# Patient Record
Sex: Male | Born: 1980 | Race: White | Hispanic: No | Marital: Married | Smoking: Never smoker
Health system: Southern US, Community
[De-identification: ages and names within clinical notes are randomized; demographics above are authoritative.]

## PROBLEM LIST (undated history)

## (undated) DIAGNOSIS — T7840XA Allergy, unspecified, initial encounter: Secondary | ICD-10-CM

## (undated) DIAGNOSIS — N200 Calculus of kidney: Secondary | ICD-10-CM

## (undated) DIAGNOSIS — K760 Fatty (change of) liver, not elsewhere classified: Secondary | ICD-10-CM

## (undated) DIAGNOSIS — Z8719 Personal history of other diseases of the digestive system: Secondary | ICD-10-CM

## (undated) HISTORY — DX: Allergy, unspecified, initial encounter: T78.40XA

---

## 2018-04-10 DIAGNOSIS — Z809 Family history of malignant neoplasm, unspecified: Secondary | ICD-10-CM | POA: Insufficient documentation

## 2018-04-10 DIAGNOSIS — E66811 Obesity, class 1: Secondary | ICD-10-CM | POA: Insufficient documentation

## 2018-04-10 DIAGNOSIS — J302 Other seasonal allergic rhinitis: Secondary | ICD-10-CM | POA: Insufficient documentation

## 2019-10-06 DIAGNOSIS — K439 Ventral hernia without obstruction or gangrene: Secondary | ICD-10-CM | POA: Insufficient documentation

## 2019-11-05 DIAGNOSIS — K429 Umbilical hernia without obstruction or gangrene: Secondary | ICD-10-CM

## 2019-11-05 HISTORY — PX: HERNIA REPAIR: SHX51

## 2019-11-05 HISTORY — DX: Umbilical hernia without obstruction or gangrene: K42.9

## 2019-11-17 DIAGNOSIS — Z09 Encounter for follow-up examination after completed treatment for conditions other than malignant neoplasm: Secondary | ICD-10-CM | POA: Insufficient documentation

## 2020-03-22 DIAGNOSIS — E7800 Pure hypercholesterolemia, unspecified: Secondary | ICD-10-CM | POA: Insufficient documentation

## 2020-03-22 DIAGNOSIS — R7989 Other specified abnormal findings of blood chemistry: Secondary | ICD-10-CM | POA: Insufficient documentation

## 2020-05-23 DIAGNOSIS — S46219A Strain of muscle, fascia and tendon of other parts of biceps, unspecified arm, initial encounter: Secondary | ICD-10-CM | POA: Insufficient documentation

## 2020-05-23 DIAGNOSIS — M25522 Pain in left elbow: Secondary | ICD-10-CM | POA: Insufficient documentation

## 2020-05-30 NOTE — Progress Notes (Signed)
[facility][id number] Mario Cooper, [place] - [address] AT [facility] [address] [place] [address] Phone: [phone] Fax: [phone]      Your procedure is scheduled on Thursday July 1  Report to [facility] Main Entrance "A" at 0530 A.M., and check in at the Admitting office.  Call this number if you have problems the morning of surgery:  [phone]  Call [phone] if you have any questions prior to your surgery date Monday-Friday 8am-4pm    Remember:  Do not eat  after midnight the night before your surgery  You may drink clear liquids until 0430 am the morning of your surgery.   Clear liquids allowed are: Water, Non-Citrus Juices (without pulp), Carbonated Beverages, Clear Tea, Black Coffee Only, and Gatorade   Enhanced Recovery after Surgery for Orthopedics Enhanced Recovery after Surgery is a protocol used to improve the stress on your body and your recovery after surgery.  Patient Instructions  . The night before surgery:  o No food after midnight. ONLY clear liquids after midnight   . The day of surgery (if you have diabetes): o  o Drink ONE (1) Gatorade 2 (G2) by 0430 am the morning of surgery o This drink was given to you during your hospital  pre-op appointment visit.  Color of the Gatorade may vary. Red is not allowed. o Nothing else to drink after completing the  Gatorade 2 (G2).         If you have questions, please contact your surgeon's office.     Take these medicines the morning of surgery with A SIP OF WATER  loratadine (CLARITIN)  As of today, STOP taking any Aspirin (unless otherwise instructed by your surgeon) and Aspirin containing products, Aleve, Naproxen, Ibuprofen, Motrin, Advil, Goody's, BC's, all herbal medications, fish oil, and all vitamins.                      Do not wear jewelry            Do not wear lotions, powders, colognes, or deodorant.             Men may shave face  and neck.            Do not bring valuables to the hospital.            [facility] is not responsible for any belongings or valuables.  Do NOT Smoke (Tobacco/Vapping) or drink Alcohol 24 hours prior to your procedure If you use a CPAP at night, you may bring all equipment for your overnight stay.   Contacts, glasses, dentures or bridgework may not be worn into surgery.      For patients admitted to the hospital, discharge time will be determined by your treatment team.   Patients discharged the day of surgery will not be allowed to drive home, and someone needs to stay with them for 24 hours.    Special instructions:   [place]- Preparing For Surgery  Before surgery, you can play an important role. Because skin is not sterile, your skin needs to be as free of germs as possible. You can reduce the number of germs on your skin by washing with CHG (chlorahexidine gluconate) Soap before surgery.  CHG is an antiseptic cleaner which kills germs and bonds with the skin to continue killing germs even after washing.    Oral Hygiene is also important to reduce your  risk of infection.  Remember - BRUSH YOUR TEETH THE MORNING OF SURGERY WITH YOUR REGULAR TOOTHPASTE  Please do not use if you have an allergy to CHG or antibacterial soaps. If your skin becomes reddened/irritated stop using the CHG.  Do not shave (including legs and underarms) for at least 48 hours prior to first CHG shower. It is OK to shave your face.  Please follow these instructions carefully.   1. Shower the NIGHT BEFORE SURGERY and the MORNING OF SURGERY with CHG Soap.   2. If you chose to wash your hair, wash your hair first as usual with your normal shampoo.  3. After you shampoo, rinse your hair and body thoroughly to remove the shampoo.  4. Use CHG as you would any other liquid soap. You can apply CHG directly to the skin and wash gently with a scrungie or a clean washcloth.   5. Apply the CHG Soap to your body ONLY  FROM THE NECK DOWN.  Do not use on open wounds or open sores. Avoid contact with your eyes, ears, mouth and genitals (private parts). Wash Face and genitals (private parts)  with your normal soap.   6. Wash thoroughly, paying special attention to the area where your surgery will be performed.  7. Thoroughly rinse your body with warm water from the neck down.  8. DO NOT shower/wash with your normal soap after using and rinsing off the CHG Soap.  9. Pat yourself dry with a CLEAN TOWEL.  10. Wear CLEAN PAJAMAS to bed the night before surgery, wear comfortable clothes the morning of surgery  11. Place CLEAN SHEETS on your bed the night of your first shower and DO NOT SLEEP WITH PETS.   Day of Surgery:   Do not apply any deodorants/lotions.  Please wear clean clothes to the hospital/surgery center.   Remember to brush your teeth WITH YOUR REGULAR TOOTHPASTE.   Please read over the following fact sheets that you were given.

## 2020-05-31 ENCOUNTER — Other Ambulatory Visit: Payer: Self-pay

## 2020-05-31 ENCOUNTER — Encounter (HOSPITAL_COMMUNITY): Payer: Self-pay

## 2020-05-31 ENCOUNTER — Other Ambulatory Visit (HOSPITAL_COMMUNITY)
Admission: RE | Admit: 2020-05-31 | Discharge: 2020-05-31 | Disposition: A | Discharge status: Home / Self Care | Payer: BC Managed Care – PPO | Source: Ambulatory Visit | Attending: Orthopaedic Surgery | Admitting: Orthopaedic Surgery

## 2020-05-31 ENCOUNTER — Encounter (HOSPITAL_COMMUNITY)
Admission: RE | Admit: 2020-05-31 | Discharge: 2020-05-31 | Disposition: A | Discharge status: Home / Self Care | Payer: No Typology Code available for payment source | Source: Ambulatory Visit | Attending: Orthopaedic Surgery | Admitting: Orthopaedic Surgery

## 2020-05-31 DIAGNOSIS — Z20822 Contact with and (suspected) exposure to covid-19: Secondary | ICD-10-CM | POA: Insufficient documentation

## 2020-05-31 DIAGNOSIS — Z01812 Encounter for preprocedural laboratory examination: Secondary | ICD-10-CM | POA: Diagnosis not present

## 2020-05-31 HISTORY — DX: Fatty (change of) liver, not elsewhere classified: K76.0

## 2020-05-31 HISTORY — DX: Personal history of other diseases of the digestive system: Z87.19

## 2020-05-31 LAB — CBC
HCT: 44.6 % (ref 39.0–52.0)
Hemoglobin: 15.5 g/dL (ref 13.0–17.0)
MCH: 31.7 pg (ref 26.0–34.0)
MCHC: 34.8 g/dL (ref 30.0–36.0)
MCV: 91.2 fL (ref 80.0–100.0)
Platelets: 255 10*3/uL (ref 150–400)
RBC: 4.89 MIL/uL (ref 4.22–5.81)
RDW: 11.3 % — ABNORMAL LOW (ref 11.5–15.5)
WBC: 5.5 10*3/uL (ref 4.0–10.5)
nRBC: 0 % (ref 0.0–0.2)

## 2020-05-31 LAB — COMPREHENSIVE METABOLIC PANEL
ALT: 100 U/L — ABNORMAL HIGH (ref 0–44)
AST: 46 U/L — ABNORMAL HIGH (ref 15–41)
Albumin: 4.3 g/dL (ref 3.5–5.0)
Alkaline Phosphatase: 57 U/L (ref 38–126)
Anion gap: 5 (ref 5–15)
BUN: 11 mg/dL (ref 6–20)
CO2: 26 mmol/L (ref 22–32)
Calcium: 9.3 mg/dL (ref 8.9–10.3)
Chloride: 107 mmol/L (ref 98–111)
Creatinine, Ser: 0.75 mg/dL (ref 0.61–1.24)
GFR calc Af Amer: >60 mL/min (ref >60)
GFR calc non Af Amer: >60 mL/min (ref >60)
Glucose, Bld: 96 mg/dL (ref 70–99)
Potassium: 4.1 mmol/L (ref 3.5–5.1)
Sodium: 138 mmol/L (ref 135–145)
Total Bilirubin: 0.9 mg/dL (ref 0.3–1.2)
Total Protein: 7.2 g/dL (ref 6.5–8.1)

## 2020-05-31 LAB — SURGICAL PCR SCREEN
MRSA, PCR: NEGATIVE
Staphylococcus aureus: NEGATIVE

## 2020-05-31 LAB — SARS CORONAVIRUS 2 (TAT 6-24 HRS): SARS Coronavirus 2: NEGATIVE

## 2020-05-31 MED ORDER — VANCOMYCIN HCL 1500 MG/300ML IV SOLN
1500.0000 mg | INTRAVENOUS | Status: AC
  Administered 2020-06-01: 1500 mg via INTRAVENOUS
  Filled 2020-05-31 (×2): qty 300

## 2020-05-31 MED ORDER — TRANEXAMIC ACID 1000 MG/10ML IV SOLN
2000.0000 mg | INTRAVENOUS | Status: DC
  Filled 2020-05-31: qty 20

## 2020-05-31 NOTE — Anesthesia Preprocedure Evaluation (Addendum)
Anesthesia Evaluation  Patient identified by MRN, date of birth, ID band Patient awake    Reviewed: Allergy & Precautions, H&P , NPO status , Patient's Chart, lab work & pertinent test results  Airway Mallampati: II  TM Distance: >3 FB Neck ROM: Full    Dental no notable dental hx. (+) Teeth Intact, Dental Advisory Given   Pulmonary neg pulmonary ROS,    Pulmonary exam normal breath sounds clear to auscultation       Cardiovascular Exercise Tolerance: Good negative cardio ROS   Rhythm:Regular Rate:Normal     Neuro/Psych negative neurological ROS  negative psych ROS   GI/Hepatic negative GI ROS, Neg liver ROS,   Endo/Other  negative endocrine ROS  Renal/GU negative Renal ROS  negative genitourinary   Musculoskeletal   Abdominal   Peds  Hematology negative hematology ROS (+)   Anesthesia Other Findings   Reproductive/Obstetrics negative OB ROS                            Anesthesia Physical Anesthesia Plan  ASA: II  Anesthesia Plan: MAC and Regional   Post-op Pain Management:    Induction: Intravenous  PONV Risk Score and Plan: 1 and Propofol infusion, Midazolam, Dexamethasone and Ondansetron  Airway Management Planned: Simple Face Mask  Additional Equipment:   Intra-op Plan:   Post-operative Plan:   Informed Consent: I have reviewed the patients History and Physical, chart, labs and discussed the procedure including the risks, benefits and alternatives for the proposed anesthesia with the patient or authorized representative who has indicated his/her understanding and acceptance.       Plan Discussed with: CRNA and Surgeon  Anesthesia Plan Comments: (Hx of fatty liver followed by PCP with chronic mild elevation of transaminases. Previous hepatitis panel negative in 2015. Last labs by PCP 03/22/20 show AST 45 and ALT 117. Preop labs show AST 46 and ALT 100. Remainder of  labs unremarkable.)      Anesthesia Quick Evaluation

## 2020-05-31 NOTE — Progress Notes (Signed)
PCP - [facility] Cardiologist - denies  PPM/ICD - denies  Chest x-ray - N/A EKG - N/A Stress Test - denies ECHO - denies Cardiac Cath - denies  Sleep Study - denies CPAP - N/A  DM: denies  Blood Thinner Instructions: N/A Aspirin Instructions: N/A  ERAS Protcol - Yes PRE-SURGERY Ensure or G2- G2 given per order  COVID TEST- Scheduled for today 05/31/2020 after PAT appointment. Patient verbalized understanding of self-quarantine instructions, appointment time and place.  Anesthesia review: YES, records requested Per patient was told had a fatty liver, liver enzymes were elevated  Patient denies shortness of breath, fever, cough and chest pain at PAT appointment  All instructions explained to the patient, with a verbal understanding of the material. Patient agrees to go over the instructions while at home for a better understanding. Patient also instructed to self quarantine after being tested for COVID-19. The opportunity to ask questions was provided.

## 2020-06-01 ENCOUNTER — Ambulatory Visit (HOSPITAL_COMMUNITY): Payer: No Typology Code available for payment source | Admitting: Anesthesiology

## 2020-06-01 ENCOUNTER — Encounter (HOSPITAL_COMMUNITY): Payer: Self-pay | Admitting: Orthopaedic Surgery

## 2020-06-01 ENCOUNTER — Ambulatory Visit (HOSPITAL_COMMUNITY): Payer: No Typology Code available for payment source | Admitting: Physician Assistant

## 2020-06-01 ENCOUNTER — Ambulatory Visit (HOSPITAL_COMMUNITY)
Admission: RE | Admit: 2020-06-01 | Discharge: 2020-06-01 | Disposition: A | Discharge status: Home / Self Care | Payer: No Typology Code available for payment source | Attending: Orthopaedic Surgery | Admitting: Orthopaedic Surgery

## 2020-06-01 ENCOUNTER — Encounter (HOSPITAL_COMMUNITY)
Admission: RE | Disposition: A | Discharge status: Home / Self Care | Payer: Self-pay | Source: Home / Self Care | Attending: Orthopaedic Surgery

## 2020-06-01 ENCOUNTER — Ambulatory Visit (HOSPITAL_COMMUNITY): Payer: No Typology Code available for payment source

## 2020-06-01 DIAGNOSIS — Z880 Allergy status to penicillin: Secondary | ICD-10-CM | POA: Insufficient documentation

## 2020-06-01 DIAGNOSIS — S46212A Strain of muscle, fascia and tendon of other parts of biceps, left arm, initial encounter: Secondary | ICD-10-CM | POA: Diagnosis not present

## 2020-06-01 DIAGNOSIS — Y990 Civilian activity done for income or pay: Secondary | ICD-10-CM | POA: Insufficient documentation

## 2020-06-01 DIAGNOSIS — Z79899 Other long term (current) drug therapy: Secondary | ICD-10-CM | POA: Diagnosis not present

## 2020-06-01 DIAGNOSIS — X509XXA Other and unspecified overexertion or strenuous movements or postures, initial encounter: Secondary | ICD-10-CM | POA: Insufficient documentation

## 2020-06-01 HISTORY — PX: DISTAL BICEPS TENDON REPAIR: SHX1461

## 2020-06-01 SURGERY — REPAIR, TENDON, BICEPS, DISTAL
Anesthesia: Monitor Anesthesia Care | Site: Arm Upper | Laterality: Left

## 2020-06-01 MED ORDER — FENTANYL CITRATE (PF) 250 MCG/5ML IJ SOLN
INTRAMUSCULAR | Status: AC
  Filled 2020-06-01: qty 5

## 2020-06-01 MED ORDER — PROPOFOL 500 MG/50ML IV EMUL
INTRAVENOUS | Status: DC | PRN
  Administered 2020-06-01: 50 ug/kg/min via INTRAVENOUS

## 2020-06-01 MED ORDER — BUPIVACAINE HCL (PF) 0.25 % IJ SOLN
INTRAMUSCULAR | Status: AC
  Filled 2020-06-01: qty 30

## 2020-06-01 MED ORDER — CHLORHEXIDINE GLUCONATE 4 % EX LIQD: 60.0000 mL | Freq: Once | CUTANEOUS | Status: DC

## 2020-06-01 MED ORDER — MIDAZOLAM HCL 2 MG/2ML IJ SOLN
INTRAMUSCULAR | Status: DC | PRN
  Administered 2020-06-01: 2 mg via INTRAVENOUS

## 2020-06-01 MED ORDER — LACTATED RINGERS IV SOLN: INTRAVENOUS | Status: DC

## 2020-06-01 MED ORDER — TRANEXAMIC ACID 1000 MG/10ML IV SOLN
INTRAVENOUS | Status: DC | PRN
  Administered 2020-06-01: 2000 mg via TOPICAL

## 2020-06-01 MED ORDER — BUPIVACAINE-EPINEPHRINE (PF) 0.5% -1:200000 IJ SOLN
INTRAMUSCULAR | Status: DC | PRN
  Administered 2020-06-01: 30 mL via PERINEURAL

## 2020-06-01 MED ORDER — PROPOFOL 10 MG/ML IV BOLUS
INTRAVENOUS | Status: AC
  Filled 2020-06-01: qty 20

## 2020-06-01 MED ORDER — CHLORHEXIDINE GLUCONATE 0.12 % MT SOLN
OROMUCOSAL | Status: AC
  Administered 2020-06-01: 15 mL via OROMUCOSAL
  Filled 2020-06-01: qty 15

## 2020-06-01 MED ORDER — PROPOFOL 1000 MG/100ML IV EMUL
INTRAVENOUS | Status: AC
  Filled 2020-06-01: qty 100

## 2020-06-01 MED ORDER — ONDANSETRON HCL 4 MG/2ML IJ SOLN
INTRAMUSCULAR | Status: AC
  Filled 2020-06-01: qty 2

## 2020-06-01 MED ORDER — ACETAMINOPHEN 500 MG PO TABS
1000.0000 mg | ORAL_TABLET | Freq: Once | ORAL | Status: AC
  Administered 2020-06-01: 1000 mg via ORAL
  Filled 2020-06-01: qty 2

## 2020-06-01 MED ORDER — ORAL CARE MOUTH RINSE: 15.0000 mL | Freq: Once | OROMUCOSAL | Status: AC

## 2020-06-01 MED ORDER — HYDROCODONE-ACETAMINOPHEN 5-325 MG PO TABS: 1.0000 | ORAL_TABLET | Freq: Four times a day (QID) | ORAL | 0 refills | Status: DC | PRN

## 2020-06-01 MED ORDER — CHLORHEXIDINE GLUCONATE 0.12 % MT SOLN: 15.0000 mL | Freq: Once | OROMUCOSAL | Status: AC

## 2020-06-01 MED ORDER — POVIDONE-IODINE 10 % EX SWAB
2.0000 "application " | Freq: Once | CUTANEOUS | Status: AC
  Administered 2020-06-01: 2 via TOPICAL

## 2020-06-01 MED ORDER — VANCOMYCIN HCL IN DEXTROSE 1-5 GM/200ML-% IV SOLN
INTRAVENOUS | Status: AC
  Filled 2020-06-01: qty 200

## 2020-06-01 MED ORDER — FENTANYL CITRATE (PF) 250 MCG/5ML IJ SOLN
INTRAMUSCULAR | Status: DC | PRN
  Administered 2020-06-01: 50 ug via INTRAVENOUS

## 2020-06-01 MED ORDER — HYDROMORPHONE HCL 1 MG/ML IJ SOLN: 0.2500 mg | INTRAMUSCULAR | Status: DC | PRN

## 2020-06-01 MED ORDER — CELECOXIB 200 MG PO CAPS: 200.0000 mg | ORAL_CAPSULE | Freq: Once | ORAL | Status: AC

## 2020-06-01 MED ORDER — CELECOXIB 200 MG PO CAPS
ORAL_CAPSULE | ORAL | Status: AC
  Administered 2020-06-01: 200 mg via ORAL
  Filled 2020-06-01: qty 1

## 2020-06-01 MED ORDER — LACTATED RINGERS IV SOLN: INTRAVENOUS | Status: DC | PRN

## 2020-06-01 MED ORDER — LIDOCAINE 2% (20 MG/ML) 5 ML SYRINGE
INTRAMUSCULAR | Status: AC
  Filled 2020-06-01: qty 5

## 2020-06-01 MED ORDER — DEXAMETHASONE SODIUM PHOSPHATE 10 MG/ML IJ SOLN
INTRAMUSCULAR | Status: AC
  Filled 2020-06-01: qty 1

## 2020-06-01 MED ORDER — CLONIDINE HCL (ANALGESIA) 100 MCG/ML EP SOLN
EPIDURAL | Status: DC | PRN
  Administered 2020-06-01: 50 ug

## 2020-06-01 MED ORDER — LIDOCAINE 2% (20 MG/ML) 5 ML SYRINGE
INTRAMUSCULAR | Status: DC | PRN
  Administered 2020-06-01: 40 mg via INTRAVENOUS

## 2020-06-01 MED ORDER — MIDAZOLAM HCL 2 MG/2ML IJ SOLN
INTRAMUSCULAR | Status: AC
  Filled 2020-06-01: qty 2

## 2020-06-01 MED ORDER — ONDANSETRON HCL 4 MG/2ML IJ SOLN
INTRAMUSCULAR | Status: DC | PRN
  Administered 2020-06-01: 4 mg via INTRAVENOUS

## 2020-06-01 MED ORDER — DEXAMETHASONE SODIUM PHOSPHATE 10 MG/ML IJ SOLN
INTRAMUSCULAR | Status: DC | PRN
Start: 2020-06-01 — End: 2020-06-01
  Administered 2020-06-01: 4 mg via INTRAVENOUS

## 2020-06-01 SURGICAL SUPPLY — 62 items
BLADE CLIPPER SURG (BLADE) IMPLANT
BNDG ELASTIC 3X5.8 VLCR STR LF (GAUZE/BANDAGES/DRESSINGS) ×3 IMPLANT
BNDG ELASTIC 4X5.8 VLCR STR LF (GAUZE/BANDAGES/DRESSINGS) ×3 IMPLANT
BNDG ESMARK 4X9 LF (GAUZE/BANDAGES/DRESSINGS) ×3 IMPLANT
BNDG GAUZE ELAST 4 BULKY (GAUZE/BANDAGES/DRESSINGS) ×3 IMPLANT
BUTTON DISTAL BICEPS (Orthopedic Implant) ×3 IMPLANT
CHLORAPREP W/TINT 26 (MISCELLANEOUS) ×3 IMPLANT
CLOSURE STERI-STRIP 1/2X4 (GAUZE/BANDAGES/DRESSINGS) ×1
CLOSURE WOUND 1/2 X4 (GAUZE/BANDAGES/DRESSINGS) ×2
CLSR STERI-STRIP ANTIMIC 1/2X4 (GAUZE/BANDAGES/DRESSINGS) ×2 IMPLANT
CORD BIPOLAR FORCEPS 12FT (ELECTRODE) ×3 IMPLANT
COVER SURGICAL LIGHT HANDLE (MISCELLANEOUS) ×3 IMPLANT
COVER WAND RF STERILE (DRAPES) IMPLANT
CUFF TOURN SGL QUICK 18X4 (TOURNIQUET CUFF) ×3 IMPLANT
CUFF TOURN SGL QUICK 24 (TOURNIQUET CUFF) ×2
CUFF TRNQT CYL 24X4X16.5-23 (TOURNIQUET CUFF) ×1 IMPLANT
DECANTER SPIKE VIAL GLASS SM (MISCELLANEOUS) ×3 IMPLANT
DRAPE INCISE IOBAN 66X45 STRL (DRAPES) ×3 IMPLANT
DRAPE OEC MINIVIEW 54X84 (DRAPES) ×3 IMPLANT
DRAPE SURG 17X23 STRL (DRAPES) ×3 IMPLANT
DRAPE U-SHAPE 47X51 STRL (DRAPES) ×3 IMPLANT
DRSG PAD ABDOMINAL 8X10 ST (GAUZE/BANDAGES/DRESSINGS) ×3 IMPLANT
ELECT REM PT RETURN 9FT ADLT (ELECTROSURGICAL) ×3
ELECTRODE REM PT RTRN 9FT ADLT (ELECTROSURGICAL) ×1 IMPLANT
GAUZE SPONGE 4X4 12PLY STRL (GAUZE/BANDAGES/DRESSINGS) ×3 IMPLANT
GAUZE XEROFORM 5X9 LF (GAUZE/BANDAGES/DRESSINGS) IMPLANT
GLOVE BIOGEL PI IND STRL 8 (GLOVE) ×1 IMPLANT
GLOVE BIOGEL PI INDICATOR 8 (GLOVE) ×2
GLOVE SURG SYN 7.5  E (GLOVE) ×2
GLOVE SURG SYN 7.5 E (GLOVE) ×1 IMPLANT
GOWN STRL REUS W/ TWL LRG LVL3 (GOWN DISPOSABLE) ×2 IMPLANT
GOWN STRL REUS W/TWL LRG LVL3 (GOWN DISPOSABLE) ×4
INSERTER BUTTON (SYSTAGENIX WOUND MANAGEMENT) ×3 IMPLANT
KIT BASIN OR (CUSTOM PROCEDURE TRAY) ×3 IMPLANT
KIT TURNOVER KIT B (KITS) ×3 IMPLANT
LOOP VESSEL MAXI BLUE (MISCELLANEOUS) IMPLANT
MANIFOLD NEPTUNE II (INSTRUMENTS) IMPLANT
NEEDLE 1/2 CIR CATGUT .05X1.09 (NEEDLE) ×3 IMPLANT
NEEDLE HYPO 25GX1X1/2 BEV (NEEDLE) IMPLANT
NS IRRIG 1000ML POUR BTL (IV SOLUTION) ×3 IMPLANT
PACK ORTHO EXTREMITY (CUSTOM PROCEDURE TRAY) ×3 IMPLANT
PAD ARMBOARD 7.5X6 YLW CONV (MISCELLANEOUS) ×6 IMPLANT
PAD CAST 4YDX4 CTTN HI CHSV (CAST SUPPLIES) ×1 IMPLANT
PADDING CAST COTTON 4X4 STRL (CAST SUPPLIES) ×2
PIN DRILL ACL TIGHTROPE 4MM (PIN) ×3 IMPLANT
SLING ARM IMMOBILIZER LRG (SOFTGOODS) ×3 IMPLANT
SPLINT FIBERGLASS 4X30 (CAST SUPPLIES) ×3 IMPLANT
SPONGE LAP 4X18 RFD (DISPOSABLE) ×3 IMPLANT
STRIP CLOSURE SKIN 1/2X4 (GAUZE/BANDAGES/DRESSINGS) ×4 IMPLANT
SUT MNCRL AB 4-0 PS2 18 (SUTURE) ×3 IMPLANT
SUT SUPRAMID 3-0 (SUTURE) IMPLANT
SUT VIC AB 2-0 CT1 27 (SUTURE) ×2
SUT VIC AB 2-0 CT1 TAPERPNT 27 (SUTURE) ×1 IMPLANT
SUT VIC AB 3-0 FS2 27 (SUTURE) ×3 IMPLANT
SYR CONTROL 10ML LL (SYRINGE) IMPLANT
TOWEL GREEN STERILE (TOWEL DISPOSABLE) ×3 IMPLANT
TOWEL GREEN STERILE FF (TOWEL DISPOSABLE) ×3 IMPLANT
TUBE CONNECTING 12'X1/4 (SUCTIONS) ×1
TUBE CONNECTING 12X1/4 (SUCTIONS) ×2 IMPLANT
UNDERPAD 30X36 HEAVY ABSORB (UNDERPADS AND DIAPERS) ×3 IMPLANT
WATER STERILE IRR 1000ML POUR (IV SOLUTION) ×3 IMPLANT
YANKAUER SUCT BULB TIP NO VENT (SUCTIONS) ×3 IMPLANT

## 2020-06-01 NOTE — Transfer of Care (Signed)
Immediate Anesthesia Transfer of Care Note  Patient: Mario Cooper  Procedure(s) Performed: DISTAL BICEPS TENDON REPAIR (Left Arm Upper)  Patient Location: PACU  Anesthesia Type:MAC  Level of Consciousness: awake and patient cooperative  Airway & Oxygen Therapy: Patient Spontanous Breathing  Post-op Assessment: Report given to RN and Post -op Vital signs reviewed and stable  Post vital signs: Reviewed and stable  Last Vitals:  Vitals Value Taken Time  BP 126/84 06/01/20 0900  Temp    Pulse 73 06/01/20 0901  Resp 15 06/01/20 0901  SpO2 98 % 06/01/20 0901  Vitals shown include unvalidated device data.  Last Pain: There were no vitals filed for this visit.       Complications: No complications documented.

## 2020-06-01 NOTE — Discharge Instructions (Signed)
Discharge Instructions  - Keep dressings in place. Do not remove them. - The dressings must stay dry - Take all medication as prescribed. Transition to over the counter pain medication as your pain improves - Keep the hand elevated over the next 48-72 hours to help with pain and swelling - Move all digits not restricted by the dressings regularly to prevent stiffness - Please call to schedule a follow up appointment with Dr. Kohana Amble and therapy at [phone] for 10-14 days following surgery - Your pain medication have been sent digitally to your pharmacy  

## 2020-06-01 NOTE — Anesthesia Procedure Notes (Signed)
Procedure Name: MAC Date/Time: 06/01/2020 7:30 AM Performed by: Michele Rockers, CRNA Pre-anesthesia Checklist: Patient identified, Emergency Drugs available, Suction available, Timeout performed and Patient being monitored Patient Re-evaluated:Patient Re-evaluated prior to induction Oxygen Delivery Method: Simple face mask

## 2020-06-01 NOTE — H&P (Signed)
ORTHOPAEDIC H&P  PCP:  Medicine, [facility]  Chief Complaint: Left distal biceps tendon tear  HPI: Mario Cooper is a 39 y.o. male who complains of left distal bicep tendon tear.  2 weeks ago he was at work when he was moving a heavy cart.  He felt a pop in his left antecubital fossa and had swelling and ecchymosis to this area.  He was seen in my clinic and had an exam concerning for retracted distal bicep tendon tear.  A subsequent MRI was obtained which showed full-thickness rupture of the distal bicep tendon with approximately 6 cm of retraction.  We had a long discussion regarding treatment options including both nonoperative and operative management.  Nonoperative management would risk continued arm pain, elbow flexion and supination weakness.  Alternatively we did discuss proceeding forward with surgical fixation of his left distal biceps tendon.  The risks of surgery were discussed with him extensively in clinic and after having this discussion he did agree and wish to proceed forward with fixation of his left distal bicep tendon.  Past Medical History:  Diagnosis Date  . Fatty liver   . History of hiatal hernia   . Umbilical hernia 11/05/2019   Past Surgical History:  Procedure Laterality Date  . HERNIA REPAIR  11/05/2019   umbilical and hiatal   Social History   Socioeconomic History  . Marital status: Married    Spouse name: Not on file  . Number of children: Not on file  . Years of education: Not on file  . Highest education level: Not on file  Occupational History  . Not on file  Tobacco Use  . Smoking status: Never Smoker  . Smokeless tobacco: Never Used  Vaping Use  . Vaping Use: Never used  Substance and Sexual Activity  . Alcohol use: Never  . Drug use: Never  . Sexual activity: Not on file  Other Topics Concern  . Not on file  Social History Narrative  . Not on file   Social Determinants of Health   Financial Resource Strain:   .  Difficulty of Paying Living Expenses:   Food Insecurity:   . Worried About Programme researcher, broadcasting/film/video in the Last Year:   . Barista in the Last Year:   Transportation Needs:   . Freight forwarder (Medical):   Marland Kitchen Lack of Transportation (Non-Medical):   Physical Activity:   . Days of Exercise per Week:   . Minutes of Exercise per Session:   Stress:   . Feeling of Stress :   Social Connections:   . Frequency of Communication with Friends and Family:   . Frequency of Social Gatherings with Friends and Family:   . Attends Religious Services:   . Active Member of Clubs or Organizations:   . Attends Banker Meetings:   Marland Kitchen Marital Status:    History reviewed. No pertinent family history. Allergies  Allergen Reactions  . Penicillins Other (See Comments)    Childhood reaction   Prior to Admission medications   Medication Sig Start Date End Date Taking? Authorizing Provider  loratadine (CLARITIN) 10 MG tablet Take 10 mg by mouth daily.    [provider]  Multiple Vitamin (MULTIVITAMIN WITH MINERALS) TABS tablet Take 1 tablet by mouth daily.    [provider]  Omega-3 Fatty Acids (FISH OIL) 500 MG CAPS Take 500 mg by mouth daily.    [provider]   No  results found.  Positive ROS: All other systems have been reviewed and were otherwise negative with the exception of those mentioned in the HPI and as above.  Physical Exam: Constitutional: Healthy-appearing and normal body habitus who is in NAD. Ambulation normal.  Psychiatric: Normal affect. Oriented x3  Cardiovascular: Left radial pulse 2+ and capillary refill test normal. Edema none.  Musculoskeletal: Left hand no atrophy. Strength Left APB 5/5 and 1st DI 5/5.  Neurologic: Normal sensation to the ulnar nerve distribution, radial nerve distribution, and median nerve distribution.   Hand: Examination of the left upper extremity shows a grossly normal appearing left hand. There is  visible ecchymosis in the antecubital fossa and medial elbow. There are no open wounds, skin tenting or signs of infection. Patient has tenderness palpation along the antecubital fossa. The biceps tendon is not palpable within the antecubital fossa. He has tenderness to palpation distally in the arm and he has a visible contour abnormality of the biceps muscle when compared to the contralateral side. He has full range of motion of the digits and wrist.  Assessment: Left distal biceps tendon tear  Plan: Plan to proceed forward with open repair of the left distal biceps tendon.  Risks and benefits of surgery were discussed with patient which include but are not limited to infection, bleeding, damage to surrounding structures including blood vessels and nerves, pain, stiffness, tendon retear and need for additional procedures.  Informed consent was obtained and the patient's left arm was marked.  Plan for discharge home postoperatively with follow-up with me in approximately 10 to 14 days to begin early range of motion exercises.    Ernest Mallick, MD [phone]   06/01/2020 7:00 AM

## 2020-06-01 NOTE — Anesthesia Procedure Notes (Signed)
Anesthesia Regional Block: Supraclavicular block   Pre-Anesthetic Checklist: ,, timeout performed, Correct Patient, Correct Site, Correct Laterality, Correct Procedure, Correct Position, site marked, Risks and benefits discussed, pre-op evaluation,  At surgeon's request and post-op pain management  Laterality: Left  Prep: Maximum Sterile Barrier Precautions used, chloraprep       Needles:  Injection technique: Single-shot  Needle Type: Echogenic Stimulator Needle     Needle Length: 9cm  Needle Gauge: 21     Additional Needles:   Procedures:,,,, ultrasound used (permanent image in chart),,,,  Narrative:  Start time: 06/01/2020 7:03 AM End time: 06/01/2020 7:13 AM Injection made incrementally with aspirations every 5 mL. Anesthesiologist: Gaynelle Adu, MD  Additional Notes: 2% Lidocaine skin wheel.

## 2020-06-01 NOTE — Op Note (Signed)
PREOPERATIVE DIAGNOSIS: Left distal bicep tendon rupture  POSTOPERATIVE DIAGNOSIS: Same  ATTENDING PHYSICIAN: Maudry Mayhew. Jeannie Fend, III, MD who was present and scrubbed for the entire case   ASSISTANT SURGEON: None.   ANESTHESIA: Regional with MAC  SURGICAL PROCEDURES: Left distal bicep tendon repair  SURGICAL INDICATIONS: Patient is a 39 year old male who at work approximately 2 weeks ago was moving a heavy cart.  He felt a pop along the left antecubital fossa.  He subsequently developed significant pain and swelling in his arm.  He was seen by me in clinic where he was found to have exam consistent with a left distal bicep tendon rupture.  Subsequent MRI was obtained which confirmed this diagnosis and showed approximately 6 cm of proximal retraction of the tendon stump.  We had a long discussion regarding treatment options and I did recommend proceeding forward with surgical fixation of his distal bicep tendon.  This would help to prevent continued left arm pain and weakness and allow him to return back to work full duty.  We discussed the risk and benefits of surgery in clinic and after having this discussion he did wish to proceed and presents today for that.  FINDING: There is complete rupture of the distal bicep tendon with retraction into the antecubital fossa.  Successful distal bicep tendon repair was achieved using Arthrex Endobutton kit.  DESCRIPTION OF PROCEDURE: The patient was identified in the preoperative holding area where the risk benefits and alternatives of the procedure were discussed with patient once again.  These risks include but are not limited to infection, bleeding, damage to surrounding structures including blood vessels and nerves, pain, stiffness, tendon retear and need for additional procedures.  Informed consent was obtained at that time the patient's left arm was marked with a surgical marking pen.  He then underwent a left upper extremity plexus block by anesthesia.   He was brought to the operative suite where timeout was performed identifying the correct patient operative site.  He was positioned supine on the operative table with his hand outstretched on a hand table.  Preoperative antibiotics were administered.  The patient was induced under MAC sedation.  The left upper extremity was then prepped and draped in usual sterile fashion.  A sterile tourniquet was then placed on the upper arm.  The limb was exsanguinated and the tourniquet was inflated.  A longitudinal incision was made in the proximal, radial forearm.  This was approximately 2 to 3 fingerbreadths distal to the elbow flexion crease.  Blunt dissection was carried down through the subcutaneous tissues.  Working proximally, hematoma was visualized and the distal tendon stump was easily found in the subcutaneous tissues within the antecubital fossa.  A clamp was placed on the tendon and which was then retracted into the wound.  Further deep dissection was then performed.  Care was taken to protect crossing venous structures as dissection was continued deep to the bicipital tuberosity.  Fluoroscopic images were obtained which confirmed appropriate positioning and location of the bicipital tuberosity as dissection was performed.  Once reaching the proximal radius, there was visualization of bare bone at the bicipital tuberosity.  There is also some few strands of residual biceps tendon still attached to the tuberosity.  This area was gently debrided using a Cobb elevator as well as a rondure.  The guidepin for the Arthrex Endobutton set was then placed in the bicipital tuberosity and advanced bicortically.  This was confirmed to be in appropriate position under fluoroscopic images.  Attention  was then turned back to the biceps tendon stump.  The tendon and was gently debrided using pickup and 15 blade which removed some degenerative, frayed tendon tissue as well as inflamed tenosynovium.  A fiber loop suture was  then passed in a whipstitch fashion utilizing a Keith needle.  The suture tails were passed deep to the crossing vascular structures and the Endobutton was secured on the tendon ends.  A 7.5 mm reamer was then advanced over top of the guidepin unit cortically.  The wound was copiously irrigated removing the bony fragments.  The Endobutton was then passed through this reamed hole and the initial hole from the guidepin and secured on the posterior cortex of the proximal radius.  The button was flipped and the tendon was reduced down into the reamed hole.  This had secure fit and fill the hole quite well.  Single-stranded the suture was then passed twice through the tendon and tied down over top securing the biceps in its repaired position.  The elbow was gently ranged through some range of motion and found to have stable fixation of the biceps tendon with the tension on the tendon following this.  The tendon was tied with the elbow in approximately 30 to 40 degrees of flexion.  AP and lateral fluoroscopic images were once again obtained which showed secure fixation of the button on the posterior cortex of the proximal radius.  At this point the wound was once again copiously irrigated with normal saline.  The tourniquet was released and topical TXA was placed into the wound.  Hemostasis was achieved.  The patient had return of brisk capillary refill to all of his digits.  The deep skin was then closed with interrupted 3-0 Vicryl sutures followed by running 4-0 Monocryl suture and application of Steri-Strips.  4 x 4's, Kerlix and a well-padded long-arm splint were then placed.  Patient was awoken from his sedation and taken the PACU in stable condition.  He tolerated the procedure well and there were no complications.  RADIOGRAPHIC INTERPRETATION: AP and lateral radiographs of the left elbow show Endobutton secured on the posterior aspect of the proximal radius.  No fractures or dislocations are noted.  ESTIMATED  BLOOD LOSS: 10 mL  TOURNIQUET TIME: 37 minutes  SPECIMENS: None  POSTOPERATIVE PLAN: The patient will be discharged home and seen back in the office in approximately 10-14 days for wound check and then be sent to a therapist for for early range of motion of the elbow.  We will place him into a hinged elbow brace allowing full flexion but stopping 40 degrees shy of full extension.  He can then progress 10 degrees of range of motion each week until he has full range of motion.  At 6 weeks he can continue range of motion exercises and begin some light resistance exercises.  He can then begin full strengthening and exercises at 3 months postoperatively.  IMPLANTS: Arthrex Endobutton

## 2020-06-01 NOTE — Anesthesia Postprocedure Evaluation (Signed)
Anesthesia Post Note  Patient: Mario Cooper  Procedure(s) Performed: DISTAL BICEPS TENDON REPAIR (Left Arm Upper)     Patient location during evaluation: PACU Anesthesia Type: Regional and MAC Level of consciousness: awake and alert Pain management: pain level controlled Vital Signs Assessment: post-procedure vital signs reviewed and stable Respiratory status: spontaneous breathing, nonlabored ventilation and respiratory function stable Cardiovascular status: stable and blood pressure returned to baseline Postop Assessment: no apparent nausea or vomiting Anesthetic complications: no   No complications documented.  Last Vitals:  Vitals:   06/01/20 0915 06/01/20 0930  BP: 118/78 130/88  Pulse: 77 71  Resp: 17 (!) 22  Temp:  36.6 C  SpO2: 99% 98%    Last Pain:  Vitals:   06/01/20 0930  PainSc: 0-No pain                 Keimani Laufer,W. EDMOND

## 2020-06-05 ENCOUNTER — Encounter (HOSPITAL_COMMUNITY): Payer: Self-pay | Admitting: Orthopaedic Surgery

## 2021-10-06 ENCOUNTER — Encounter (HOSPITAL_BASED_OUTPATIENT_CLINIC_OR_DEPARTMENT_OTHER): Payer: Self-pay | Admitting: Emergency Medicine

## 2021-10-06 ENCOUNTER — Other Ambulatory Visit: Payer: Self-pay

## 2021-10-06 DIAGNOSIS — N23 Unspecified renal colic: Secondary | ICD-10-CM | POA: Insufficient documentation

## 2021-10-06 DIAGNOSIS — R109 Unspecified abdominal pain: Secondary | ICD-10-CM | POA: Diagnosis not present

## 2021-10-06 DIAGNOSIS — N132 Hydronephrosis with renal and ureteral calculous obstruction: Secondary | ICD-10-CM | POA: Diagnosis not present

## 2021-10-06 NOTE — ED Triage Notes (Signed)
P arrives pov with c/o left flank pain radiating to LLQ and dysuria.

## 2021-10-07 ENCOUNTER — Emergency Department (HOSPITAL_BASED_OUTPATIENT_CLINIC_OR_DEPARTMENT_OTHER): Payer: BC Managed Care – PPO

## 2021-10-07 ENCOUNTER — Emergency Department (HOSPITAL_BASED_OUTPATIENT_CLINIC_OR_DEPARTMENT_OTHER)
Admission: EM | Admit: 2021-10-07 | Discharge: 2021-10-07 | Disposition: A | Discharge status: Home / Self Care | Payer: BC Managed Care – PPO | Attending: Emergency Medicine | Admitting: Emergency Medicine

## 2021-10-07 DIAGNOSIS — N132 Hydronephrosis with renal and ureteral calculous obstruction: Secondary | ICD-10-CM | POA: Diagnosis not present

## 2021-10-07 DIAGNOSIS — N23 Unspecified renal colic: Secondary | ICD-10-CM

## 2021-10-07 LAB — CBC WITH DIFFERENTIAL/PLATELET
Abs Immature Granulocytes: 0.04 10*3/uL (ref 0.00–0.07)
Basophils Absolute: 0 10*3/uL (ref 0.0–0.1)
Basophils Relative: 0 %
Eosinophils Absolute: 0 10*3/uL (ref 0.0–0.5)
Eosinophils Relative: 0 %
HCT: 41.3 % (ref 39.0–52.0)
Hemoglobin: 15.1 g/dL (ref 13.0–17.0)
Immature Granulocytes: 0 %
Lymphocytes Relative: 10 %
Lymphs Abs: 1.3 10*3/uL (ref 0.7–4.0)
MCH: 32.8 pg (ref 26.0–34.0)
MCHC: 36.6 g/dL — ABNORMAL HIGH (ref 30.0–36.0)
MCV: 89.6 fL (ref 80.0–100.0)
Monocytes Absolute: 1.2 10*3/uL — ABNORMAL HIGH (ref 0.1–1.0)
Monocytes Relative: 10 %
Neutro Abs: 9.6 10*3/uL — ABNORMAL HIGH (ref 1.7–7.7)
Neutrophils Relative %: 80 %
Platelets: 236 10*3/uL (ref 150–400)
RBC: 4.61 MIL/uL (ref 4.22–5.81)
RDW: 11.1 % — ABNORMAL LOW (ref 11.5–15.5)
WBC: 12.1 10*3/uL — ABNORMAL HIGH (ref 4.0–10.5)
nRBC: 0 % (ref 0.0–0.2)

## 2021-10-07 LAB — BASIC METABOLIC PANEL
Anion gap: 9 (ref 5–15)
BUN: 15 mg/dL (ref 6–20)
CO2: 23 mmol/L (ref 22–32)
Calcium: 8.4 mg/dL — ABNORMAL LOW (ref 8.9–10.3)
Chloride: 102 mmol/L (ref 98–111)
Creatinine, Ser: 0.79 mg/dL (ref 0.61–1.24)
GFR, Estimated: >60 mL/min (ref >60)
Glucose, Bld: 107 mg/dL — ABNORMAL HIGH (ref 70–99)
Potassium: 3.7 mmol/L (ref 3.5–5.1)
Sodium: 134 mmol/L — ABNORMAL LOW (ref 135–145)

## 2021-10-07 LAB — URINALYSIS, ROUTINE W REFLEX MICROSCOPIC
Bilirubin Urine: NEGATIVE
Glucose, UA: NEGATIVE mg/dL
Ketones, ur: NEGATIVE mg/dL
Leukocytes,Ua: NEGATIVE
Nitrite: NEGATIVE
Protein, ur: NEGATIVE mg/dL
Specific Gravity, Urine: 1.02 (ref 1.005–1.030)
pH: 7 (ref 5.0–8.0)

## 2021-10-07 LAB — URINALYSIS, MICROSCOPIC (REFLEX)

## 2021-10-07 MED ORDER — KETOROLAC TROMETHAMINE 15 MG/ML IJ SOLN
15.0000 mg | Freq: Once | INTRAMUSCULAR | Status: AC
  Administered 2021-10-07: 15 mg via INTRAVENOUS
  Filled 2021-10-07: qty 1

## 2021-10-07 MED ORDER — TAMSULOSIN HCL 0.4 MG PO CAPS
0.4000 mg | ORAL_CAPSULE | Freq: Every day | ORAL | 0 refills | Status: DC
Start: 2021-10-07 — End: 2023-04-25

## 2021-10-07 MED ORDER — ONDANSETRON HCL 4 MG/2ML IJ SOLN
4.0000 mg | Freq: Once | INTRAMUSCULAR | Status: AC
  Administered 2021-10-07: 4 mg via INTRAVENOUS
  Filled 2021-10-07: qty 2

## 2021-10-07 MED ORDER — SODIUM CHLORIDE 0.9 % IV BOLUS
1000.0000 mL | Freq: Once | INTRAVENOUS | Status: AC
  Administered 2021-10-07: 1000 mL via INTRAVENOUS

## 2021-10-07 MED ORDER — OXYCODONE-ACETAMINOPHEN 5-325 MG PO TABS: 1.0000 | ORAL_TABLET | Freq: Four times a day (QID) | ORAL | 0 refills | Status: DC | PRN

## 2021-10-07 MED ORDER — ONDANSETRON 4 MG PO TBDP: 4.0000 mg | ORAL_TABLET | Freq: Three times a day (TID) | ORAL | 0 refills | Status: DC | PRN

## 2021-10-07 NOTE — ED Notes (Signed)
ED MD requested we contact this client re: incidental findings on radiological studies and recommend he make an appointment with is primary MD . Attempted to contact client twice today, on both episodes no answer per phone call, no opportunity to leave a voice mail to client.

## 2021-10-07 NOTE — Discharge Instructions (Addendum)
You have a 4 mm stone in your left ureter.  Drink plenty of fluids.    Get rechecked if you have fevers, cannot urinate or have uncontrolled pain.

## 2021-10-07 NOTE — ED Provider Notes (Signed)
[facility] EMERGENCY DEPARTMENT Provider Note   CSN: [id number] Arrival date & time: 10/06/21  1833     History Chief Complaint  Patient presents with   Flank Pain    Mario Cooper is a 40 y.o. male.  The history is provided by the patient.  Flank Pain Mario Cooper is a 40 y.o. male who presents to the Emergency Department complaining of flank pain.  He presents to the ED for evaluation of left flank pain.  First episode was one week ago, second episode on Thursday with associated vomiting.  Today developed recurrent pain in the left flank, nonradiating.  Pain is dull/throbbing.  Vomiting today.  Has clear urine with small clots.  No fever.  Chills today.  Dysuria today.       Past Medical History:  Diagnosis Date   Fatty liver    History of hiatal hernia    Umbilical hernia 11/05/2019    There are no problems to display for this patient.   Past Surgical History:  Procedure Laterality Date   DISTAL BICEPS TENDON REPAIR Left 06/01/2020   Procedure: DISTAL BICEPS TENDON REPAIR;  Surgeon: Ernest Mallick, MD;  Location: [facility] OR;  Service: Orthopedics;  Laterality: Left;    HERNIA REPAIR  11/05/2019   umbilical and hiatal       History reviewed. No pertinent family history.  Social History   Tobacco Use   Smoking status: Never   Smokeless tobacco: Never  Vaping Use   Vaping Use: Never used  Substance Use Topics   Alcohol use: Never   Drug use: Never    Home Medications Prior to Admission medications   Medication Sig Start Date End Date Taking? Authorizing Provider  ondansetron (ZOFRAN ODT) 4 MG disintegrating tablet Take 1 tablet (4 mg total) by mouth every 8 (eight) hours as needed for nausea or vomiting. 10/07/21  Yes Tilden Fossa, MD  oxyCODONE-acetaminophen (PERCOCET/ROXICET) 5-325 MG tablet Take 1 tablet by mouth every 6 (six) hours as needed for severe pain. 10/07/21  Yes Tilden Fossa, MD  tamsulosin (FLOMAX) 0.4 MG CAPS capsule Take 1  capsule (0.4 mg total) by mouth daily. 10/07/21  Yes Tilden Fossa, MD  HYDROcodone-acetaminophen [facility]) 5-325 MG tablet Take 1-2 tablets by mouth every 6 (six) hours as needed for moderate pain. 06/01/20   Cain Saupe III, MD  loratadine (CLARITIN) 10 MG tablet Take 10 mg by mouth daily.    [provider]  Multiple Vitamin (MULTIVITAMIN WITH MINERALS) TABS tablet Take 1 tablet by mouth daily.    [provider]  Omega-3 Fatty Acids (FISH OIL) 500 MG CAPS Take 500 mg by mouth daily.    [provider]    Allergies    Penicillins  Review of Systems   Review of Systems  Genitourinary:  Positive for flank pain.  All other systems reviewed and are negative.  Physical Exam Updated Vital Signs BP 123/72 (BP Location: Left Arm)   Pulse 67   Temp 98.6 F (37 C) (Oral)   Resp 18   Ht 6' (1.829 m)   Wt 93 kg   SpO2 97%   BMI 27.80 kg/m   Physical Exam Vitals and nursing note reviewed.  Constitutional:      Appearance: He is well-developed.  HENT:     Head: Normocephalic and atraumatic.  Cardiovascular:     Rate and Rhythm: Normal rate and regular rhythm.  Pulmonary:     Effort: Pulmonary effort is normal.  No respiratory distress.  Abdominal:     Palpations: Abdomen is soft.     Tenderness: There is no abdominal tenderness. There is no guarding or rebound.     Comments: Mild left cva tenderness  Musculoskeletal:        General: No tenderness.  Skin:    General: Skin is warm and dry.  Neurological:     Mental Status: He is alert and oriented to person, place, and time.  Psychiatric:        Behavior: Behavior normal.    ED Results / Procedures / Treatments   Labs (all labs ordered are listed, but only abnormal results are displayed) Labs Reviewed  URINALYSIS, ROUTINE W REFLEX MICROSCOPIC - Abnormal; Notable for the following components:      Result Value   Hgb urine dipstick SMALL (*)    All other components within normal limits  BASIC  METABOLIC PANEL - Abnormal; Notable for the following components:   Sodium 134 (*)    Glucose, Bld 107 (*)    Calcium 8.4 (*)    All other components within normal limits  CBC WITH DIFFERENTIAL/PLATELET - Abnormal; Notable for the following components:   WBC 12.1 (*)    MCHC 36.6 (*)    RDW 11.1 (*)    Neutro Abs 9.6 (*)    Monocytes Absolute 1.2 (*)    All other components within normal limits  URINALYSIS, MICROSCOPIC (REFLEX) - Abnormal; Notable for the following components:   Bacteria, UA RARE (*)    All other components within normal limits    EKG None  Radiology CT Renal Stone Study  Result Date: 10/07/2021 CLINICAL DATA:  Flank pain.  Concern for kidney stone. EXAM: CT ABDOMEN AND PELVIS WITHOUT CONTRAST TECHNIQUE: Multidetector CT imaging of the abdomen and pelvis was performed following the standard protocol without IV contrast. COMPARISON:  None. FINDINGS: Evaluation of this exam is limited in the absence of intravenous contrast. Lower chest: The visualized lung bases are clear. Coronary vascular calcification noted, advanced for the patient's age. No intra-abdominal free air or free fluid. Hepatobiliary: No focal liver abnormality is seen. No gallstones, gallbladder wall thickening, or biliary dilatation. Pancreas: Unremarkable. No pancreatic ductal dilatation or surrounding inflammatory changes. Spleen: Normal in size without focal abnormality. Adrenals/Urinary Tract: The adrenal glands unremarkable. There is a 4 mm stone at the left ureterovesical junction with mild left hydronephrosis. The right kidney, right ureter, and urinary bladder appear unremarkable. Stomach/Bowel: There is no bowel obstruction or active inflammation. The appendix is normal. Vascular/Lymphatic: The abdominal aorta and IVC are unremarkable. No portal vein gas. There is no adenopathy. Reproductive: The prostate and seminal vesicles are grossly unremarkable. Other: None Musculoskeletal: No acute or significant  osseous findings. IMPRESSION: 1. A 4 mm left UVJ stone with mild left hydronephrosis. 2. Coronary vascular calcification, advanced for the patient's age. Electronically Signed   By: Elgie Collard M.D.   On: 10/07/2021 02:33    Procedures Procedures   Medications Ordered in ED Medications  sodium chloride 0.9 % bolus 1,000 mL (0 mLs Intravenous Stopped 10/07/21 0514)  ondansetron (ZOFRAN) injection 4 mg (4 mg Intravenous Given 10/07/21 0228)  ketorolac (TORADOL) 15 MG/ML injection 15 mg (15 mg Intravenous Given 10/07/21 0229)    ED Course  I have reviewed the triage vital signs and the nursing notes.  Pertinent labs & imaging results that were available during my care of the patient were reviewed by me and considered in my medical decision making (see  chart for details).    MDM Rules/Calculators/A&P                          patient here for evaluation of one week of intermittent flank pain. Patient in severe pain on initial assessment. He was treated with medications with resolution of his pain on reassessment. CT scan is positive for a 4 mm distal ureteral stone. UA not consistent with UTI. BMP with normal renal function. Discussed with patient home care for renal colic. Discussed PCP or urology follow-up as well as return precautions.  There was an incidental finding of advance atherosclerosis for age on CT scan - this was not communicate with the patient at his time of ED visit. Nursing staff to inform patient via telephone communication regarding this incidental finding and need for outpatient follow-up for risk factor modification.  Final Clinical Impression(s) / ED Diagnoses Final diagnoses:  Ureteral colic    Rx / DC Orders ED Discharge Orders          Ordered    tamsulosin (FLOMAX) 0.4 MG CAPS capsule  Daily        10/07/21 0423    ondansetron (ZOFRAN ODT) 4 MG disintegrating tablet  Every 8 hours PRN        10/07/21 0423    oxyCODONE-acetaminophen (PERCOCET/ROXICET)  5-325 MG tablet  Every 6 hours PRN        10/07/21 0426             Tilden Fossa, MD 10/07/21 [id number]

## 2021-11-15 DIAGNOSIS — J069 Acute upper respiratory infection, unspecified: Secondary | ICD-10-CM | POA: Diagnosis not present

## 2021-11-15 DIAGNOSIS — E669 Obesity, unspecified: Secondary | ICD-10-CM | POA: Diagnosis not present

## 2021-11-15 DIAGNOSIS — R0981 Nasal congestion: Secondary | ICD-10-CM | POA: Diagnosis not present

## 2021-11-15 DIAGNOSIS — R059 Cough, unspecified: Secondary | ICD-10-CM | POA: Diagnosis not present

## 2021-11-21 DIAGNOSIS — Z6830 Body mass index (BMI) 30.0-30.9, adult: Secondary | ICD-10-CM | POA: Diagnosis not present

## 2021-11-21 DIAGNOSIS — J208 Acute bronchitis due to other specified organisms: Secondary | ICD-10-CM | POA: Diagnosis not present

## 2021-11-21 DIAGNOSIS — B9689 Other specified bacterial agents as the cause of diseases classified elsewhere: Secondary | ICD-10-CM | POA: Diagnosis not present

## 2022-05-08 DIAGNOSIS — J0100 Acute maxillary sinusitis, unspecified: Secondary | ICD-10-CM | POA: Diagnosis not present

## 2022-05-08 DIAGNOSIS — Z6830 Body mass index (BMI) 30.0-30.9, adult: Secondary | ICD-10-CM | POA: Diagnosis not present

## 2022-12-04 IMAGING — CT CT RENAL STONE PROTOCOL
2 of 4 series · 16 of 46 positions shown, 18 images · non-contrast
Comparison: None.

CLINICAL DATA: Flank pain.  Concern for kidney stone.

EXAM:
CT ABDOMEN AND PELVIS WITHOUT CONTRAST
TECHNIQUE: Multidetector CT imaging of the abdomen and pelvis was performed
following the standard protocol without IV contrast.

[Series 2: axial st · axial · 0.96mm/px · z∈[+722,+1182]mm · 13 of 102 slices shown, 15 images]
[im 5/102  soft-tissue]
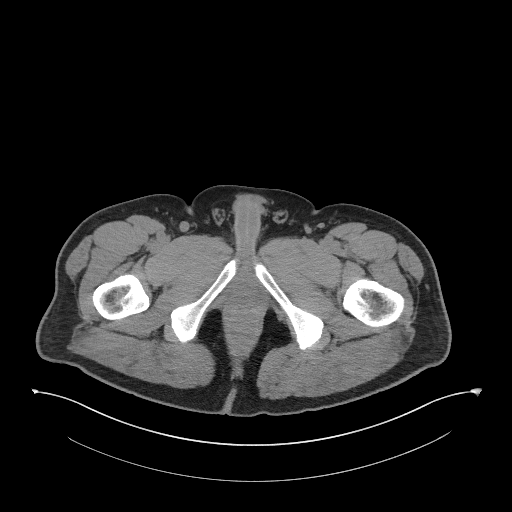
[im 5/102  bone]
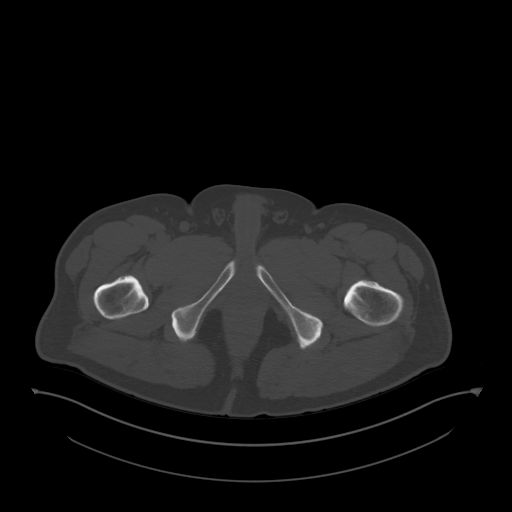
[im 13/102  soft-tissue]
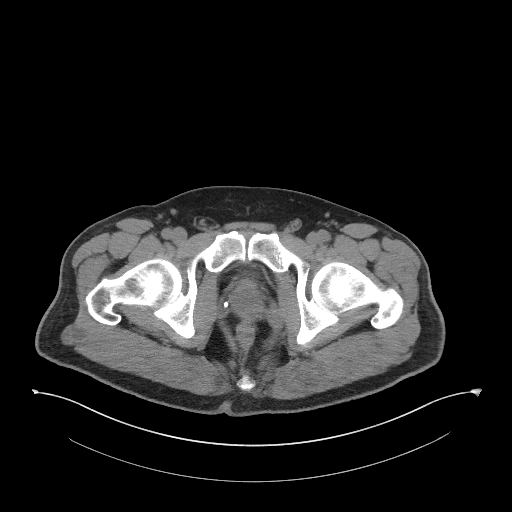
[im 22/102  soft-tissue]
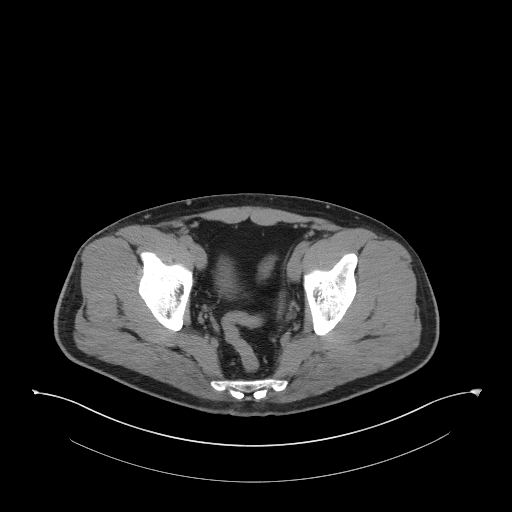
[im 30/102  soft-tissue]
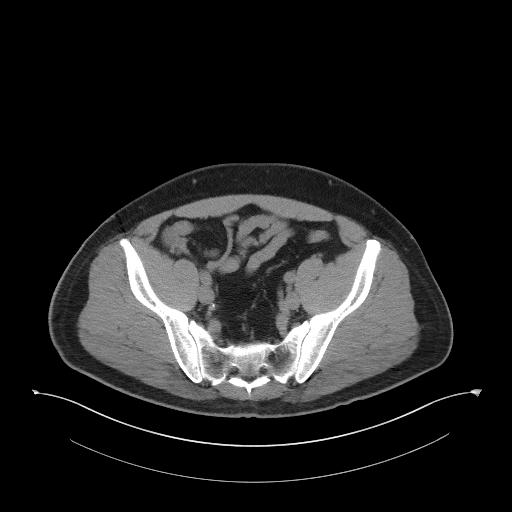
[im 34/102  soft-tissue]
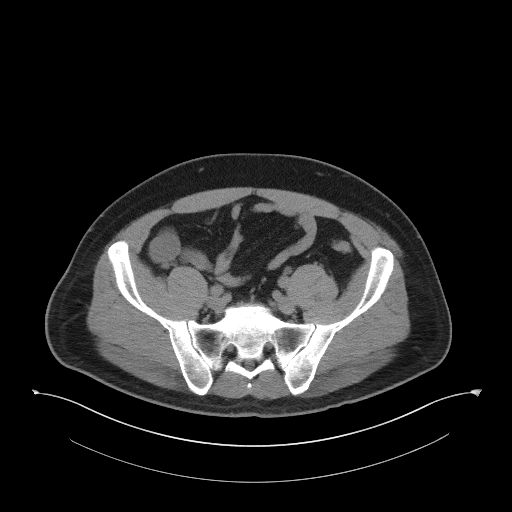
[im 43/102  soft-tissue]
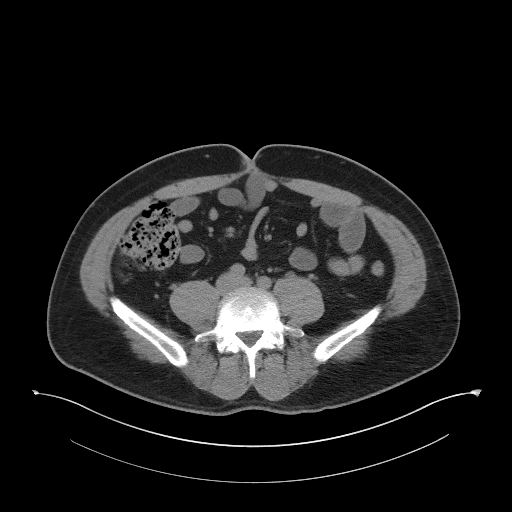
[im 51/102  soft-tissue]
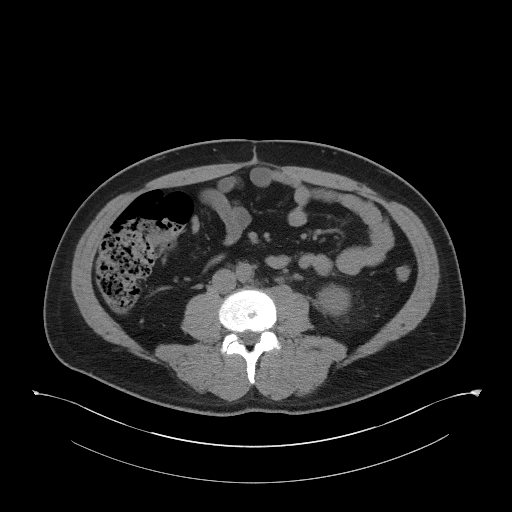
[im 59/102  soft-tissue]
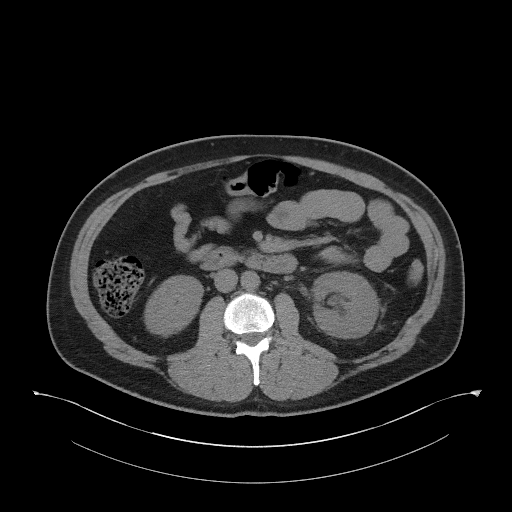
[im 68/102  soft-tissue]
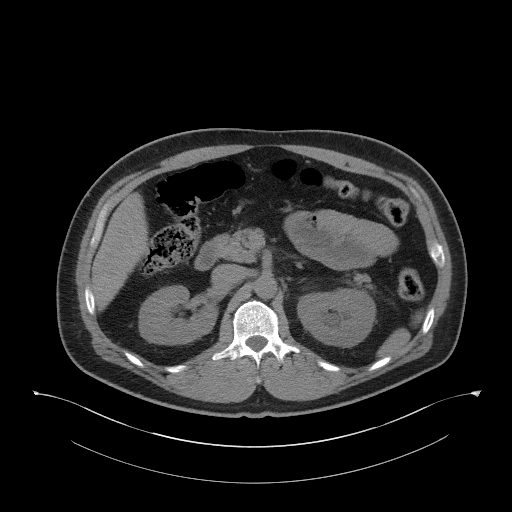
[im 68/102  bone]
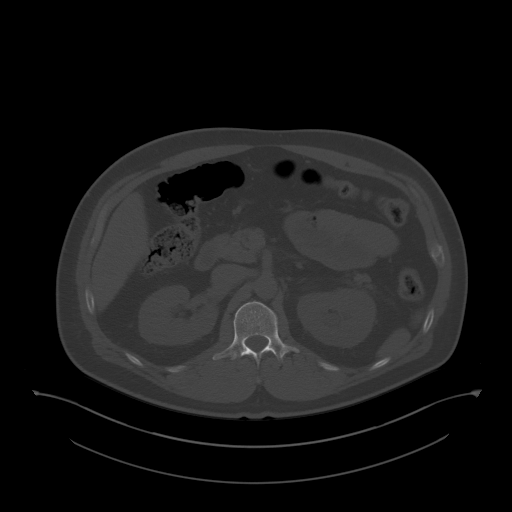
[im 72/102  soft-tissue]
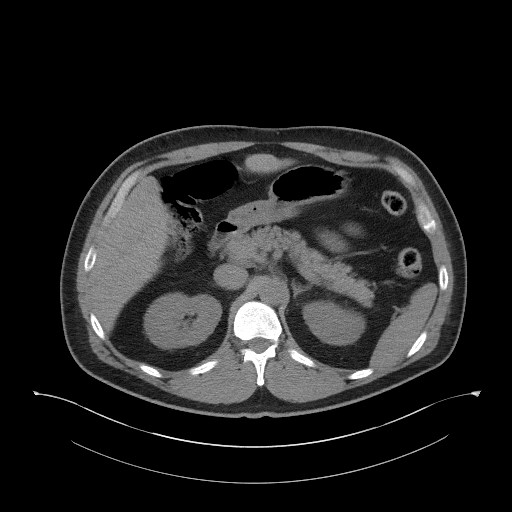
[im 80/102  soft-tissue]
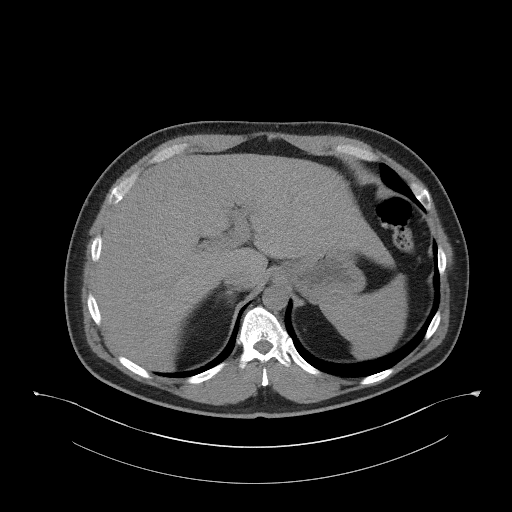
[im 89/102  soft-tissue]
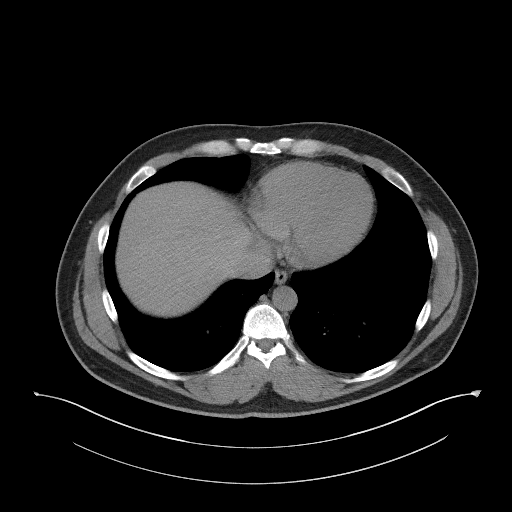
[im 97/102  soft-tissue]
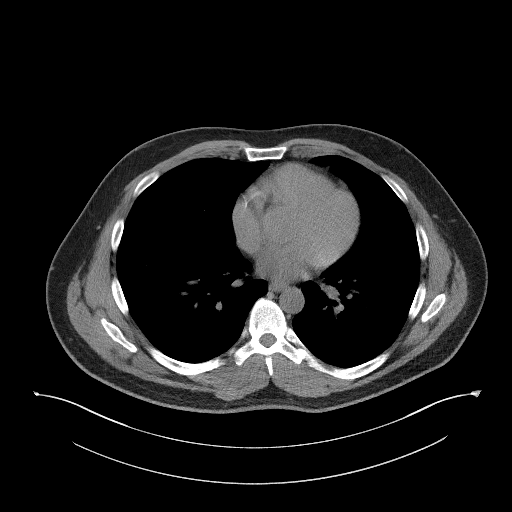

[Series 5: coronal st · coronal · 0.85mm/px · 3 of 101 slices shown]
[im 34/101  soft-tissue]
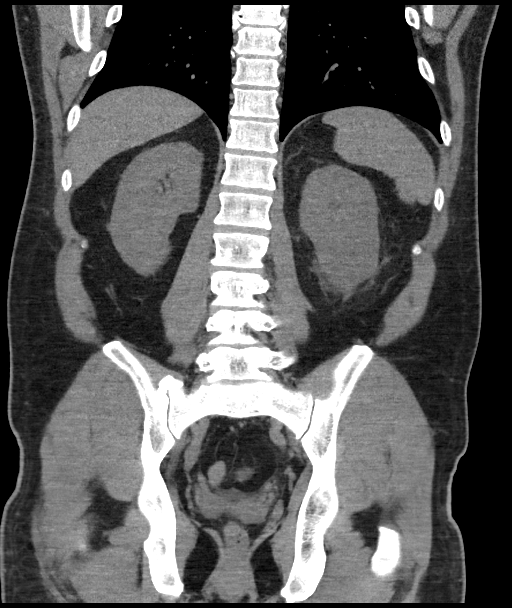
[im 45/101  soft-tissue]
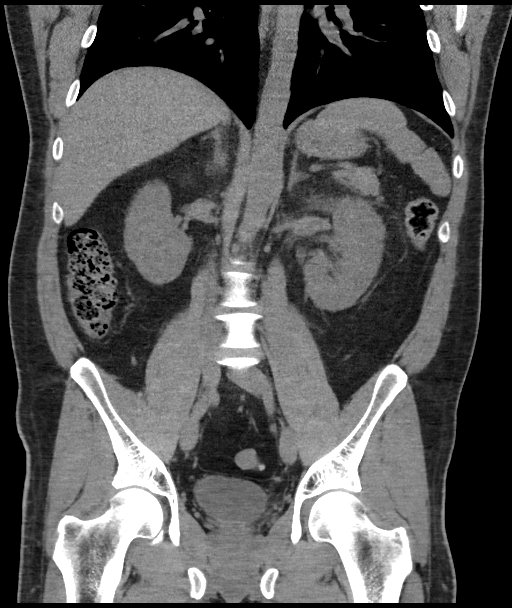
[im 56/101  soft-tissue]
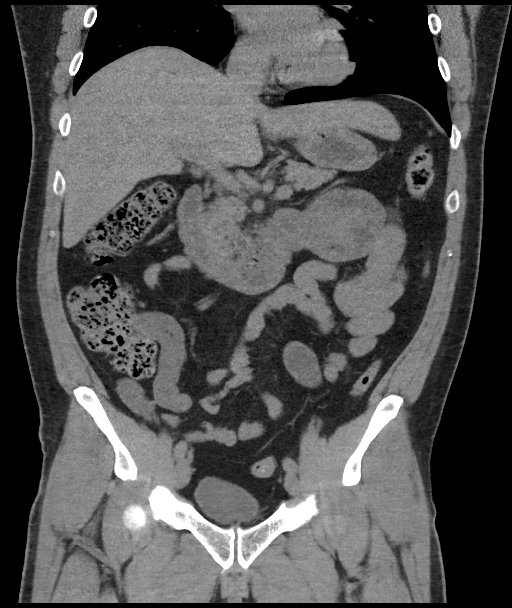

[16 of 46 positions shown; findings below may reference images not displayed]

FINDINGS: Evaluation of this exam is limited in the absence of intravenous
contrast.

Lower chest: The visualized lung bases are clear. Coronary vascular
calcification noted, advanced for the patient's age.

No intra-abdominal free air or free fluid.

Hepatobiliary: No focal liver abnormality is seen. No gallstones,
gallbladder wall thickening, or biliary dilatation.

Pancreas: Unremarkable. No pancreatic ductal dilatation or
surrounding inflammatory changes.

Spleen: Normal in size without focal abnormality.

Adrenals/Urinary Tract: The adrenal glands unremarkable. There is a
4 mm stone at the left ureterovesical junction with mild left
hydronephrosis. The right kidney, right ureter, and urinary bladder
appear unremarkable.

Stomach/Bowel: There is no bowel obstruction or active inflammation.
The appendix is normal.

Vascular/Lymphatic: The abdominal aorta and IVC are unremarkable. No
portal vein gas. There is no adenopathy.

Reproductive: The prostate and seminal vesicles are grossly
unremarkable.

Other: None

Musculoskeletal: No acute or significant osseous findings.
IMPRESSION: 1. A 4 mm left UVJ stone with mild left hydronephrosis.
2. Coronary vascular calcification, advanced for the patient's age.

## 2023-01-21 DIAGNOSIS — J069 Acute upper respiratory infection, unspecified: Secondary | ICD-10-CM | POA: Diagnosis not present

## 2023-02-23 DIAGNOSIS — J0100 Acute maxillary sinusitis, unspecified: Secondary | ICD-10-CM | POA: Diagnosis not present

## 2023-04-25 ENCOUNTER — Encounter (HOSPITAL_BASED_OUTPATIENT_CLINIC_OR_DEPARTMENT_OTHER): Payer: Self-pay | Admitting: Emergency Medicine

## 2023-04-25 ENCOUNTER — Emergency Department (HOSPITAL_BASED_OUTPATIENT_CLINIC_OR_DEPARTMENT_OTHER)
Admission: EM | Admit: 2023-04-25 | Discharge: 2023-04-25 | Disposition: A | Discharge status: Home / Self Care | Payer: BC Managed Care – PPO | Attending: Emergency Medicine | Admitting: Emergency Medicine

## 2023-04-25 ENCOUNTER — Other Ambulatory Visit: Payer: Self-pay

## 2023-04-25 ENCOUNTER — Emergency Department (HOSPITAL_BASED_OUTPATIENT_CLINIC_OR_DEPARTMENT_OTHER): Payer: BC Managed Care – PPO

## 2023-04-25 DIAGNOSIS — R1110 Vomiting, unspecified: Secondary | ICD-10-CM | POA: Diagnosis not present

## 2023-04-25 DIAGNOSIS — R109 Unspecified abdominal pain: Secondary | ICD-10-CM | POA: Diagnosis not present

## 2023-04-25 DIAGNOSIS — K760 Fatty (change of) liver, not elsewhere classified: Secondary | ICD-10-CM | POA: Diagnosis not present

## 2023-04-25 DIAGNOSIS — R1012 Left upper quadrant pain: Secondary | ICD-10-CM | POA: Insufficient documentation

## 2023-04-25 HISTORY — DX: Calculus of kidney: N20.0

## 2023-04-25 LAB — CBC WITH DIFFERENTIAL/PLATELET
Abs Immature Granulocytes: 0 10*3/uL (ref 0.00–0.07)
Basophils Absolute: 0 10*3/uL (ref 0.0–0.1)
Basophils Relative: 1 %
Eosinophils Absolute: 0.1 10*3/uL (ref 0.0–0.5)
Eosinophils Relative: 2 %
HCT: 43.7 % (ref 39.0–52.0)
Hemoglobin: 15.5 g/dL (ref 13.0–17.0)
Immature Granulocytes: 0 %
Lymphocytes Relative: 36 %
Lymphs Abs: 1.8 10*3/uL (ref 0.7–4.0)
MCH: 31.8 pg (ref 26.0–34.0)
MCHC: 35.5 g/dL (ref 30.0–36.0)
MCV: 89.7 fL (ref 80.0–100.0)
Monocytes Absolute: 0.5 10*3/uL (ref 0.1–1.0)
Monocytes Relative: 11 %
Neutro Abs: 2.5 10*3/uL (ref 1.7–7.7)
Neutrophils Relative %: 50 %
Platelets: 235 10*3/uL (ref 150–400)
RBC: 4.87 MIL/uL (ref 4.22–5.81)
RDW: 11.6 % (ref 11.5–15.5)
WBC: 5 10*3/uL (ref 4.0–10.5)
nRBC: 0 % (ref 0.0–0.2)

## 2023-04-25 LAB — COMPREHENSIVE METABOLIC PANEL
ALT: 102 U/L — ABNORMAL HIGH (ref 0–44)
AST: 55 U/L — ABNORMAL HIGH (ref 15–41)
Albumin: 4.4 g/dL (ref 3.5–5.0)
Alkaline Phosphatase: 50 U/L (ref 38–126)
Anion gap: 8 (ref 5–15)
BUN: 15 mg/dL (ref 6–20)
CO2: 23 mmol/L (ref 22–32)
Calcium: 8.9 mg/dL (ref 8.9–10.3)
Chloride: 104 mmol/L (ref 98–111)
Creatinine, Ser: 0.72 mg/dL (ref 0.61–1.24)
GFR, Estimated: >60 mL/min (ref >60)
Glucose, Bld: 89 mg/dL (ref 70–99)
Potassium: 3.6 mmol/L (ref 3.5–5.1)
Sodium: 135 mmol/L (ref 135–145)
Total Bilirubin: 1.2 mg/dL (ref 0.3–1.2)
Total Protein: 7.2 g/dL (ref 6.5–8.1)

## 2023-04-25 LAB — URINALYSIS, ROUTINE W REFLEX MICROSCOPIC
Bilirubin Urine: NEGATIVE
Glucose, UA: NEGATIVE mg/dL
Ketones, ur: NEGATIVE mg/dL
Leukocytes,Ua: NEGATIVE
Nitrite: NEGATIVE
Protein, ur: NEGATIVE mg/dL
Specific Gravity, Urine: >=1.03 (ref 1.005–1.030)
pH: 5.5 (ref 5.0–8.0)

## 2023-04-25 LAB — URINALYSIS, MICROSCOPIC (REFLEX)

## 2023-04-25 MED ORDER — FAMOTIDINE 20 MG PO TABS: 20.0000 mg | ORAL_TABLET | Freq: Two times a day (BID) | ORAL | 0 refills | Status: DC

## 2023-04-25 MED ORDER — ONDANSETRON 4 MG PO TBDP: 4.0000 mg | ORAL_TABLET | Freq: Three times a day (TID) | ORAL | 0 refills | Status: DC | PRN

## 2023-04-25 MED ORDER — OMEPRAZOLE 20 MG PO CPDR: 20.0000 mg | DELAYED_RELEASE_CAPSULE | Freq: Every day | ORAL | 1 refills | Status: DC

## 2023-04-25 NOTE — Discharge Instructions (Signed)
Please read and follow all provided instructions.  Your diagnoses today include:  1. Left upper quadrant abdominal pain     Tests performed today include: Blood cell counts and platelets Kidney and liver function tests: liver function tests are elevated but stable Pancreas function test (called lipase) Urine test to look for infection CT scan of the abdomen pelvis: Did not show signs of kidney stone or other problems Vital signs. See below for your results today.   Medications prescribed:  Zofran (ondansetron) - for nausea and vomiting  Pepcid (famotidine) - antihistamine  You can find this medication over-the-counter.   DO NOT exceed:  20mg  Pepcid every 12 hours  Omeprazole (Prilosec) - stomach acid reducer  This medication can be found over-the-counter  Take any prescribed medications only as directed.  Home care instructions:  Follow any educational materials contained in this packet. Avoid alcohol and NSAID use if possible Limit Tylenol use due to known liver disease  Follow-up instructions: Please follow-up with your primary care provider in the next 7 days for further evaluation of your symptoms.    Return instructions:  SEEK IMMEDIATE MEDICAL ATTENTION IF: The pain does not go away or becomes severe  A temperature above 101F develops  Repeated vomiting occurs (multiple episodes)  The pain becomes localized to portions of the abdomen. The right side could possibly be appendicitis. In an adult, the left lower portion of the abdomen could be colitis or diverticulitis.  Blood is being passed in stools or vomit (bright red or black tarry stools)  You develop chest pain, difficulty breathing, dizziness or fainting, or become confused, poorly responsive, or inconsolable (young children) If you have any other emergent concerns regarding your health  Additional Information: Abdominal (belly) pain can be caused by many things. Your caregiver performed an examination and  possibly ordered blood/urine tests and imaging (CT scan, x-rays, ultrasound). Many cases can be observed and treated at home after initial evaluation in the emergency department. Even though you are being discharged home, abdominal pain can be unpredictable. Therefore, you need a repeated exam if your pain does not resolve, returns, or worsens. Most patients with abdominal pain don't have to be admitted to the hospital or have surgery, but serious problems like appendicitis and gallbladder attacks can start out as nonspecific pain. Many abdominal conditions cannot be diagnosed in one visit, so follow-up evaluations are very important.  Your vital signs today were: BP (!) 136/99   Pulse 83   Temp 98.7 F (37.1 C) (Oral)   Resp 18   Wt 105.7 kg   SpO2 98%   BMI 31.60 kg/m  If your blood pressure (bp) was elevated above 135/85 this visit, please have this repeated by your doctor within one month. --------------

## 2023-04-25 NOTE — ED Provider Notes (Signed)
[place] EMERGENCY DEPARTMENT AT [facility] Provider Note   CSN: [id number] Arrival date & time: 04/25/23  7829     History  Chief Complaint  Patient presents with   Flank Pain    left    Mario Cooper is a 42 y.o. male.  Patient with history of fatty liver disease presents to the emergency department today for evaluation of left upper quadrant and left-sided flank pain.  Symptoms have been intermittent for about 1 month.  Patient has a history of kidney stone, however current pain has not been quite as severe.  Today while at work, he had recurrent left upper quadrant pain with associated vomiting.  This prompted ED visit.  Currently symptoms are controlled.  No hematuria or dysuria.  He has a history of umbilical hernia repair few years ago, however no increasing swelling or pain around the site.  No diarrhea.  No chest pain or shortness of breath.  Does report taking 2 ibuprofen last week.  Denies alcohol use.  Patient had previous left-sided stone which passed spontaneously after about 2 weeks, was 4 mm per his report.       Home Medications Prior to Admission medications   Medication Sig Start Date End Date Taking? Authorizing Provider  HYDROcodone-acetaminophen (NORCO) 5-325 MG tablet Take 1-2 tablets by mouth every 6 (six) hours as needed for moderate pain. 06/01/20   Cain Saupe III, MD  loratadine (CLARITIN) 10 MG tablet Take 10 mg by mouth daily.    [provider]  Multiple Vitamin (MULTIVITAMIN WITH MINERALS) TABS tablet Take 1 tablet by mouth daily.    [provider]  Omega-3 Fatty Acids (FISH OIL) 500 MG CAPS Take 500 mg by mouth daily.    [provider]  ondansetron (ZOFRAN ODT) 4 MG disintegrating tablet Take 1 tablet (4 mg total) by mouth every 8 (eight) hours as needed for nausea or vomiting. 10/07/21   Tilden Fossa, MD  oxyCODONE-acetaminophen (PERCOCET/ROXICET) 5-325 MG tablet Take 1 tablet by mouth every 6 (six)  hours as needed for severe pain. 10/07/21   Tilden Fossa, MD  tamsulosin (FLOMAX) 0.4 MG CAPS capsule Take 1 capsule (0.4 mg total) by mouth daily. 10/07/21   Tilden Fossa, MD      Allergies    Penicillins    Review of Systems   Review of Systems  Physical Exam Updated Vital Signs BP (!) 136/99   Pulse 83   Temp 98.7 F (37.1 C) (Oral)   Resp 18   Wt 105.7 kg   SpO2 98%   BMI 31.60 kg/m   Physical Exam Vitals and nursing note reviewed.  Constitutional:      General: He is not in acute distress.    Appearance: He is well-developed.  HENT:     Head: Normocephalic and atraumatic.  Eyes:     General:        Right eye: No discharge.        Left eye: No discharge.     Conjunctiva/sclera: Conjunctivae normal.  Cardiovascular:     Rate and Rhythm: Normal rate and regular rhythm.     Heart sounds: Normal heart sounds.  Pulmonary:     Effort: Pulmonary effort is normal.     Breath sounds: Normal breath sounds.  Abdominal:     Palpations: Abdomen is soft.     Tenderness: There is no abdominal tenderness. There is no guarding or rebound.  Musculoskeletal:     Cervical back: Normal range  of motion and neck supple.  Skin:    General: Skin is warm and dry.  Neurological:     Mental Status: He is alert.     ED Results / Procedures / Treatments   Labs (all labs ordered are listed, but only abnormal results are displayed) Labs Reviewed  URINALYSIS, ROUTINE W REFLEX MICROSCOPIC - Abnormal; Notable for the following components:      Result Value   Hgb urine dipstick TRACE (*)    All other components within normal limits  URINALYSIS, MICROSCOPIC (REFLEX) - Abnormal; Notable for the following components:   Bacteria, UA RARE (*)    All other components within normal limits  COMPREHENSIVE METABOLIC PANEL - Abnormal; Notable for the following components:   AST 55 (*)    ALT 102 (*)    All other components within normal limits  CBC WITH DIFFERENTIAL/PLATELET     EKG None  Radiology CT Renal Stone Study  Result Date: 04/25/2023 CLINICAL DATA:  Abdominal/flank pain, stone suspected L upper abd pain, h/o renal stone, h/o hernia repair. EXAM: CT ABDOMEN AND PELVIS WITHOUT CONTRAST TECHNIQUE: Multidetector CT imaging of the abdomen and pelvis was performed following the standard protocol without IV contrast. RADIATION DOSE REDUCTION: This exam was performed according to the departmental dose-optimization program which includes automated exposure control, adjustment of the mA and/or kV according to patient size and/or use of iterative reconstruction technique. COMPARISON:  CT abdomen/pelvis 10/07/2021. FINDINGS: Lower chest: No acute abnormality.  Coronary artery calcifications. Hepatobiliary: Hepatic steatosis. No focal lesion. Gallbladder is unremarkable. No biliary dilatation. Pancreas: Unremarkable. No pancreatic ductal dilatation or surrounding inflammatory changes. Spleen: Normal in size without focal abnormality. Adrenals/Urinary Tract: Adrenal glands are unremarkable. Kidneys are normal, without renal calculi, focal lesion, or hydronephrosis. Bladder is unremarkable for degree of distention. Stomach/Bowel: Normal stomach and duodenum. No dilated loops of small bowel. Normal appendix is visualized on coronal image 57 series 5. No bowel wall thickening or surrounding inflammation. Vascular/Lymphatic: Aortic atherosclerosis. No enlarged abdominal or pelvic lymph nodes. Reproductive: Prostate is unremarkable. Other: No abdominal wall hernia or abnormality. No abdominopelvic ascites. Musculoskeletal: No acute or significant osseous findings. IMPRESSION: 1. No acute abdominopelvic findings. Specifically, no evidence of nephrolithiasis or hydronephrosis. 2. Hepatic steatosis. 3. Coronary artery atherosclerosis. Aortic Atherosclerosis (ICD10-I70.0). Electronically Signed   By: Orvan Falconer M.D.   On: 04/25/2023 12:16    Procedures Procedures    Medications  Ordered in ED Medications - No data to display  ED Course/ Medical Decision Making/ A&P    Patient seen and examined. History obtained directly from patient.   Labs/EKG: Ordered CBC, CMP, UA.  Imaging: Ordered CT renal protocol.  Medications/Fluids: Ordered: None ordered.   Most recent vital signs reviewed and are as follows: BP (!) 136/99   Pulse 83   Temp 98.7 F (37.1 C) (Oral)   Resp 18   Wt 105.7 kg   SpO2 98%   BMI 31.60 kg/m   Initial impression: Left-sided abdominal pain, evaluate for kidney stone  1:12 PM Reassessment performed. Patient appears comfortable, stable.  Pain remains controlled.  Labs personally reviewed and interpreted including: CBC unremarkable; CMP with elevated transaminases but stable in setting of known hepatic steatosis; urine without compelling signs of infection or significant blood  Imaging personally visualized and interpreted including: CT abdomen pelvis, agree no acute findings  Reviewed pertinent lab work and imaging with patient at bedside. Questions answered.   Most current vital signs reviewed and are as follows: BP (!) 136/99  Pulse 83   Temp 98.7 F (37.1 C) (Oral)   Resp 18   Wt 105.7 kg   SpO2 98%   BMI 31.60 kg/m   Plan: Discharge to home.   Prescriptions written for: Zofran, Pepcid, omeprazole  Other home care instructions discussed: Avoid heavy NSAID use, avoid alcohol.  Discussed limiting Tylenol.  Avoid foods that make symptoms worse.  ED return instructions discussed: The patient was urged to return to the Emergency Department immediately with worsening of current symptoms, worsening abdominal pain, persistent vomiting, blood noted in stools, fever, or any other concerns. The patient verbalized understanding.   Follow-up instructions discussed: Patient encouraged to follow-up with their PCP in 7 days.                               Medical Decision Making Amount and/or Complexity of Data Reviewed Labs:  ordered. Radiology: ordered.  Risk Prescription drug management.   For this patient's complaint of abdominal pain, the following conditions were considered on the differential diagnosis: gastritis/PUD, enteritis/duodenitis, appendicitis, cholelithiasis/cholecystitis, cholangitis, pancreatitis, ruptured viscus, colitis, diverticulitis, small/large bowel obstruction, proctitis, cystitis, pyelonephritis, ureteral colic, aortic dissection, aortic aneurysm. Atypical chest etiologies were also considered including ACS, PE, and pneumonia.  The patient's vital signs, pertinent lab work and imaging were reviewed and interpreted as discussed in the ED course. Hospitalization was considered for further testing, treatments, or serial exams/observation. However as patient is well-appearing, has a stable exam, and reassuring studies today, I do not feel that they warrant admission at this time. This plan was discussed with the patient who verbalizes agreement and comfort with this plan and seems reliable and able to return to the Emergency Department with worsening or changing symptoms.          Final Clinical Impression(s) / ED Diagnoses Final diagnoses:  Left upper quadrant abdominal pain    Rx / DC Orders ED Discharge Orders          Ordered    ondansetron (ZOFRAN-ODT) 4 MG disintegrating tablet  Every 8 hours PRN        04/25/23 1310    famotidine (PEPCID) 20 MG tablet  2 times daily        04/25/23 1310    omeprazole (PRILOSEC) 20 MG capsule  Daily        04/25/23 1310              Renne Crigler, PA-C 04/25/23 1314    Horton, Clabe Seal, DO 04/26/23 [phone]

## 2023-04-25 NOTE — ED Triage Notes (Addendum)
Flank pain /LUQ pain x 1 month , got worse today . Vomited too. Hx kidney stones.  Denies hematuria .

## 2023-06-27 DIAGNOSIS — M25571 Pain in right ankle and joints of right foot: Secondary | ICD-10-CM | POA: Diagnosis not present

## 2023-06-27 DIAGNOSIS — M1990 Unspecified osteoarthritis, unspecified site: Secondary | ICD-10-CM | POA: Insufficient documentation

## 2023-08-11 ENCOUNTER — Ambulatory Visit: Payer: BC Managed Care – PPO | Admitting: Physician Assistant

## 2023-08-11 VITALS — BP 130/78 | HR 71 | Temp 97.9°F | Resp 16 | Ht 71.0 in | Wt 242.2 lb

## 2023-08-11 DIAGNOSIS — K429 Umbilical hernia without obstruction or gangrene: Secondary | ICD-10-CM | POA: Diagnosis not present

## 2023-08-11 DIAGNOSIS — K760 Fatty (change of) liver, not elsewhere classified: Secondary | ICD-10-CM | POA: Diagnosis not present

## 2023-08-11 DIAGNOSIS — N200 Calculus of kidney: Secondary | ICD-10-CM

## 2023-08-11 DIAGNOSIS — J302 Other seasonal allergic rhinitis: Secondary | ICD-10-CM

## 2023-08-11 DIAGNOSIS — Z8719 Personal history of other diseases of the digestive system: Secondary | ICD-10-CM | POA: Insufficient documentation

## 2023-08-11 MED ORDER — LEVOCETIRIZINE DIHYDROCHLORIDE 5 MG PO TABS: 5.0000 mg | ORAL_TABLET | Freq: Every evening | ORAL | 0 refills | Status: DC

## 2023-08-11 NOTE — Assessment & Plan Note (Signed)
Controlled through diet and exercise Denies any major issues or concerns Labs will be drawn around next Monday Will adjust treatment depending on labs

## 2023-08-11 NOTE — Progress Notes (Signed)
New Patient Office Visit  Subjective    Patient ID: Mario Cooper, male    DOB: Oct 02, 1981  Age: 42 y.o. MRN: [id number]  CC: No chief complaint on file.   HPI Mario Cooper presents to establish care History of kidney stones, bicept tear of the left arm aroiund 05/2020, Has a history of umbilical hernias that he has had repaired. No major complaints or concerns. Discussed future appointments and when we would look at doing any procedures like colonoscopies, or PSA lab work. Is not currently fasting and would like to be fasting to better see how accurate his labs could be.   Outpatient Encounter Medications as of 08/11/2023  Medication Sig   levocetirizine (XYZAL ALLERGY 24HR) 5 MG tablet Take 1 tablet (5 mg total) by mouth every evening.   loratadine (CLARITIN) 10 MG tablet Take 10 mg by mouth daily.   meloxicam (MOBIC) 15 MG tablet SMARTSIG:1 Tablet(s) By Mouth   Multiple Vitamin (MULTIVITAMIN WITH MINERALS) TABS tablet Take 1 tablet by mouth daily.   Omega-3 Fatty Acids (FISH OIL) 500 MG CAPS Take 500 mg by mouth daily.   predniSONE (DELTASONE) 20 MG tablet Take 20 mg by mouth 2 (two) times daily.   [DISCONTINUED] famotidine (PEPCID) 20 MG tablet Take 1 tablet (20 mg total) by mouth 2 (two) times daily.   [DISCONTINUED] omeprazole (PRILOSEC) 20 MG capsule Take 1 capsule (20 mg total) by mouth daily.   [DISCONTINUED] ondansetron (ZOFRAN-ODT) 4 MG disintegrating tablet Take 1 tablet (4 mg total) by mouth every 8 (eight) hours as needed for nausea or vomiting.   No facility-administered encounter medications on file as of 08/11/2023.    Past Medical History:  Diagnosis Date   Allergy    Fatty liver    History of hiatal hernia    Kidney stones    Umbilical hernia 11/05/2019    Past Surgical History:  Procedure Laterality Date   DISTAL BICEPS TENDON REPAIR Left 06/01/2020   Procedure: DISTAL BICEPS TENDON REPAIR;  Surgeon: Ernest Mallick, MD;  Location: [facility] OR;  Service:  Orthopedics;  Laterality: Left;    HERNIA REPAIR  11/05/2019   umbilical and hiatal    Family History  Problem Relation Age of Onset   Obesity Father    Arthritis Maternal Grandfather     Social History   Socioeconomic History   Marital status: Married    Spouse name: Not on file   Number of children: Not on file   Years of education: Not on file   Highest education level: GED or equivalent  Occupational History   Not on file  Tobacco Use   Smoking status: Never   Smokeless tobacco: Never  Vaping Use   Vaping status: Never Used  Substance and Sexual Activity   Alcohol use: Never   Drug use: Never   Sexual activity: Yes    Birth control/protection: Condom  Other Topics Concern   Not on file  Social History Narrative   Not on file   Social Determinants of Health   Financial Resource Strain: Low Risk  (08/11/2023)   Overall Financial Resource Strain (CARDIA)    Difficulty of Paying Living Expenses: Not very hard  Food Insecurity: No Food Insecurity (08/11/2023)   Hunger Vital Sign    Worried About Running Out of Food in the Last Year: Never true    Ran Out of Food in the Last Year: Never true  Transportation Needs: No Transportation Needs (08/11/2023)   PRAPARE -  Administrator, Civil Service (Medical): No    Lack of Transportation (Non-Medical): No  Physical Activity: Sufficiently Active (08/11/2023)   Exercise Vital Sign    Days of Exercise per Week: 3 days    Minutes of Exercise per Session: 60 min  Stress: No Stress Concern Present (08/11/2023)   [employer] of Occupational Health - Occupational Stress Questionnaire    Feeling of Stress : Only a little  Social Connections: Moderately Integrated (08/11/2023)   Social Connection and Isolation Panel [NHANES]    Frequency of Communication with Friends and Family: More than three times a week    Frequency of Social Gatherings with Friends and Family: Once a week    Attends Religious Services: 1 to 4  times per year    Active Member of [employer] or Organizations: No    Attends Banker Meetings: Not on file    Marital Status: Married  Catering manager Violence: Not At Risk (08/11/2023)   Humiliation, Afraid, Rape, and Kick questionnaire    Fear of Current or Ex-Partner: No    Emotionally Abused: No    Physically Abused: No    Sexually Abused: No    Review of Systems  Constitutional:  Negative for chills and fever.  HENT:  Negative for congestion, ear pain, hearing loss and sore throat.   Eyes:  Negative for blurred vision and photophobia.  Respiratory:  Negative for cough and shortness of breath.   Cardiovascular:  Negative for chest pain and palpitations.  Gastrointestinal:  Negative for abdominal pain and nausea.        Objective    BP 130/78 (BP Location: Left Arm, Patient Position: Sitting, Cuff Size: Large)   Pulse 71   Temp 97.9 F (36.6 C) (Temporal)   Resp 16   Ht 5\' 11"  (1.803 m)   Wt 242 lb 3.2 oz (109.9 kg)   SpO2 98%   BMI 33.78 kg/m   Physical Exam Constitutional:      Appearance: Normal appearance.  HENT:     Right Ear: Tympanic membrane normal.     Left Ear: Tympanic membrane normal.     Nose: Nose normal.     Mouth/Throat:     Pharynx: No oropharyngeal exudate or posterior oropharyngeal erythema.  Eyes:     Conjunctiva/sclera: Conjunctivae normal.  Neck:     Vascular: No carotid bruit.  Cardiovascular:     Rate and Rhythm: Normal rate and regular rhythm.     Heart sounds: Normal heart sounds.  Pulmonary:     Effort: Pulmonary effort is normal.     Breath sounds: Normal breath sounds.  Abdominal:     General: Bowel sounds are normal.     Palpations: Abdomen is soft.     Tenderness: There is no abdominal tenderness.  Skin:    Findings: No lesion or rash.  Neurological:     Mental Status: He is alert and oriented to person, place, and time.  Psychiatric:        Behavior: Behavior normal.         Assessment & Plan:    Problem List Items Addressed This Visit     Fatty liver - Primary    Controlled through diet and exercise Denies any major issues or concerns Labs will be drawn around next Monday Will adjust treatment depending on labs      Relevant Orders   CBC with Differential/Platelet   Comprehensive metabolic panel   Hemoglobin A1c   Lipid  panel   History of hiatal hernia    No current issues or concerns Had 2 umbilical surgically repaired      Kidney stones    Controlled through diet Will continue to monitor for any new signs or symptoms      Relevant Orders   T4, free   TSH   Umbilical hernia   Other Visit Diagnoses     Seasonal allergies       Relevant Medications   levocetirizine (XYZAL ALLERGY 24HR) 5 MG tablet       Return in about 6 months (around 02/08/2024) for Chronic.   Langley Gauss, [place]

## 2023-08-11 NOTE — Assessment & Plan Note (Signed)
Controlled through diet Will continue to monitor for any new signs or symptoms

## 2023-08-11 NOTE — Assessment & Plan Note (Signed)
No current issues or concerns Had 2 umbilical surgically repaired

## 2023-08-17 NOTE — Addendum Note (Signed)
Addended by: Tawny Asal I on: 08/17/2023 07:15 PM   Modules accepted: Orders

## 2023-08-18 ENCOUNTER — Other Ambulatory Visit: Payer: BC Managed Care – PPO

## 2023-08-18 DIAGNOSIS — N200 Calculus of kidney: Secondary | ICD-10-CM

## 2023-08-18 DIAGNOSIS — K760 Fatty (change of) liver, not elsewhere classified: Secondary | ICD-10-CM

## 2023-08-19 LAB — CBC WITH DIFFERENTIAL/PLATELET
Basophils Absolute: 0.1 10*3/uL (ref 0.0–0.2)
Basos: 1 %
EOS (ABSOLUTE): 0.1 10*3/uL (ref 0.0–0.4)
Eos: 3 %
Hematocrit: 49.9 % (ref 37.5–51.0)
Hemoglobin: 16.9 g/dL (ref 13.0–17.7)
Immature Grans (Abs): 0 10*3/uL (ref 0.0–0.1)
Immature Granulocytes: 0 %
Lymphocytes Absolute: 1.8 10*3/uL (ref 0.7–3.1)
Lymphs: 43 %
MCH: 32.3 pg (ref 26.6–33.0)
MCHC: 33.9 g/dL (ref 31.5–35.7)
MCV: 95 fL (ref 79–97)
Monocytes Absolute: 0.5 10*3/uL (ref 0.1–0.9)
Monocytes: 13 %
Neutrophils Absolute: 1.7 10*3/uL (ref 1.4–7.0)
Neutrophils: 40 %
Platelets: 252 10*3/uL (ref 150–450)
RBC: 5.23 x10E6/uL (ref 4.14–5.80)
RDW: 12.3 % (ref 11.6–15.4)
WBC: 4.1 10*3/uL (ref 3.4–10.8)

## 2023-08-19 LAB — HEMOGLOBIN A1C
Est. average glucose Bld gHb Est-mCnc: 108 mg/dL
Hgb A1c MFr Bld: 5.4 % (ref 4.8–5.6)

## 2023-08-19 LAB — COMPREHENSIVE METABOLIC PANEL
ALT: 163 IU/L — ABNORMAL HIGH (ref 0–44)
AST: 71 IU/L — ABNORMAL HIGH (ref 0–40)
Albumin: 4.7 g/dL (ref 4.1–5.1)
Alkaline Phosphatase: 81 IU/L (ref 44–121)
BUN/Creatinine Ratio: 15 (ref 9–20)
BUN: 14 mg/dL (ref 6–24)
Bilirubin Total: 1 mg/dL (ref 0.0–1.2)
CO2: 21 mmol/L (ref 20–29)
Calcium: 9.9 mg/dL (ref 8.7–10.2)
Chloride: 101 mmol/L (ref 96–106)
Creatinine, Ser: 0.92 mg/dL (ref 0.76–1.27)
Globulin, Total: 2.5 g/dL (ref 1.5–4.5)
Glucose: 84 mg/dL (ref 70–99)
Potassium: 4.7 mmol/L (ref 3.5–5.2)
Sodium: 138 mmol/L (ref 134–144)
Total Protein: 7.2 g/dL (ref 6.0–8.5)
eGFR: 107 mL/min/{1.73_m2} (ref >59)

## 2023-08-19 LAB — LIPID PANEL
Chol/HDL Ratio: 5.9 ratio — ABNORMAL HIGH (ref 0.0–5.0)
Cholesterol, Total: 230 mg/dL — ABNORMAL HIGH (ref 100–199)
HDL: 39 mg/dL — ABNORMAL LOW (ref >39)
LDL Chol Calc (NIH): 168 mg/dL — ABNORMAL HIGH (ref 0–99)
Triglycerides: 127 mg/dL (ref 0–149)
VLDL Cholesterol Cal: 23 mg/dL (ref 5–40)

## 2023-08-19 LAB — TSH: TSH: 1.32 u[IU]/mL (ref 0.450–4.500)

## 2023-08-19 LAB — T4, FREE: Free T4: 1.13 ng/dL (ref 0.82–1.77)

## 2023-08-21 ENCOUNTER — Encounter: Payer: Self-pay | Admitting: Physician Assistant

## 2023-08-21 ENCOUNTER — Other Ambulatory Visit: Payer: Self-pay | Admitting: Physician Assistant

## 2023-08-21 DIAGNOSIS — K760 Fatty (change of) liver, not elsewhere classified: Secondary | ICD-10-CM

## 2023-08-21 DIAGNOSIS — R748 Abnormal levels of other serum enzymes: Secondary | ICD-10-CM

## 2023-08-21 DIAGNOSIS — I700 Atherosclerosis of aorta: Secondary | ICD-10-CM | POA: Insufficient documentation

## 2023-08-25 ENCOUNTER — Other Ambulatory Visit: Payer: Self-pay

## 2023-08-25 DIAGNOSIS — R748 Abnormal levels of other serum enzymes: Secondary | ICD-10-CM

## 2023-08-26 ENCOUNTER — Other Ambulatory Visit: Payer: BC Managed Care – PPO

## 2023-08-26 DIAGNOSIS — R748 Abnormal levels of other serum enzymes: Secondary | ICD-10-CM | POA: Diagnosis not present

## 2023-08-27 LAB — ACUTE HEP PANEL AND HEP B SURFACE AB
Hep A IgM: NEGATIVE
Hep B C IgM: NEGATIVE
Hep C Virus Ab: NONREACTIVE
Hepatitis B Surf Ab Quant: <3.5 m[IU]/mL — ABNORMAL LOW
Hepatitis B Surface Ag: NEGATIVE

## 2023-09-07 ENCOUNTER — Other Ambulatory Visit: Payer: Self-pay | Admitting: Physician Assistant

## 2023-09-07 DIAGNOSIS — J302 Other seasonal allergic rhinitis: Secondary | ICD-10-CM

## 2023-10-09 ENCOUNTER — Ambulatory Visit: Payer: BC Managed Care – PPO | Admitting: Physician Assistant

## 2024-04-27 DIAGNOSIS — J069 Acute upper respiratory infection, unspecified: Secondary | ICD-10-CM | POA: Diagnosis not present

## 2024-04-27 DIAGNOSIS — R051 Acute cough: Secondary | ICD-10-CM | POA: Diagnosis not present

## 2024-04-27 DIAGNOSIS — R0981 Nasal congestion: Secondary | ICD-10-CM | POA: Diagnosis not present

## 2024-06-15 NOTE — Progress Notes (Unsigned)
 Subjective:  Patient ID: Mario Cooper, male    DOB: 1980/12/10  Age: 43 y.o. MRN: [id number]  No chief complaint on file.  HPI:  Well Adult Physical: Patient here for a comprehensive physical exam.The patient reports {problems:16946} Do you take any herbs or supplements that were not prescribed by a doctor? {yes/no/not asked:9010} Are you taking calcium  supplements? {yes/no:63} Are you taking aspirin daily? {yes/no:63}  Encounter for general adult medical examination without abnormal findings  Physical (At Risk items are starred): Patient's last physical exam was 1 year ago .  Patient wears a seat belt, has smoke detectors, has carbon monoxide detectors, practices appropriate gun safety, and wears sunscreen with extended sun exposure. Dental Care: biannual cleanings, brushes and flosses daily. Ophthalmology/Optometry: Annual visit.  Hearing loss: none Vision impairments: none Last PSA:     08/11/2023    1:50 PM  Depression screen PHQ 2/9  Decreased Interest 0  Down, Depressed, Hopeless 0  PHQ - 2 Score 0  Altered sleeping 0  Tired, decreased energy 1  Change in appetite 1  Feeling bad or failure about yourself  0  Trouble concentrating 0  Moving slowly or fidgety/restless 0  Suicidal thoughts 0  PHQ-9 Score 2         06/01/2020    6:53 AM 10/06/2021    6:48 PM 08/11/2023    1:50 PM  Fall Risk  Falls in the past year?   0  Was there an injury with Fall?   0  Fall Risk Category Calculator   0  (RETIRED) Patient Fall Risk Level Low fall risk  Low fall risk    Patient at Risk for Falls Due to   No Fall Risks  Fall risk Follow up   Falls evaluation completed     Data saved with a previous flowsheet row definition              Past Medical History:  Diagnosis Date   Allergy    Fatty liver    History of hiatal hernia    Kidney stones    Umbilical hernia 11/05/2019   Past Surgical History:  Procedure Laterality Date   DISTAL BICEPS TENDON REPAIR Left 06/01/2020    Procedure: DISTAL BICEPS TENDON REPAIR;  Surgeon: Carolee Lynwood JINNY DOUGLAS, MD;  Location: [facility] OR;  Service: Orthopedics;  Laterality: Left;    HERNIA REPAIR  11/05/2019   umbilical and hiatal    Family History  Problem Relation Age of Onset   Obesity Father    Arthritis Maternal Grandfather    Social History   Socioeconomic History   Marital status: Married    Spouse name: Not on file   Number of children: Not on file   Years of education: Not on file   Highest education level: GED or equivalent  Occupational History   Not on file  Tobacco Use   Smoking status: Never   Smokeless tobacco: Never  Vaping Use   Vaping status: Never Used  Substance and Sexual Activity   Alcohol use: Never   Drug use: Never   Sexual activity: Yes    Birth control/protection: Condom  Other Topics Concern   Not on file  Social History Narrative   Not on file   Social Drivers of Health   Financial Resource Strain: Low Risk  (08/11/2023)   Overall Financial Resource Strain (CARDIA)    Difficulty of Paying Living Expenses: Not very hard  Food Insecurity: No Food Insecurity (08/11/2023)   Hunger  Vital Sign    Worried About Programme researcher, broadcasting/film/video in the Last Year: Never true    Ran Out of Food in the Last Year: Never true  Transportation Needs: No Transportation Needs (08/11/2023)   PRAPARE - Administrator, Civil Service (Medical): No    Lack of Transportation (Non-Medical): No  Physical Activity: Sufficiently Active (08/11/2023)   Exercise Vital Sign    Days of Exercise per Week: 3 days    Minutes of Exercise per Session: 60 min  Stress: No Stress Concern Present (08/11/2023)   [employer] of Occupational Health - Occupational Stress Questionnaire    Feeling of Stress : Only a little  Social Connections: Moderately Integrated (08/11/2023)   Social Connection and Isolation Panel    Frequency of Communication with Friends and Family: More than three times a week    Frequency of  Social Gatherings with Friends and Family: Once a week    Attends Religious Services: 1 to 4 times per year    Active Member of [employer] or Organizations: No    Attends Engineer, structural: Not on file    Marital Status: Married   Review of Systems   Objective:  There were no vitals taken for this visit.     08/11/2023    1:48 PM 04/25/2023   10:19 AM 04/25/2023   10:15 AM  BP/Weight  Systolic BP 130 136   Diastolic BP 78 99   Wt. (Lbs) 242.2  233  BMI 33.78 kg/m2  31.6 kg/m2    Physical Exam  Lab Results  Component Value Date   WBC 4.1 08/18/2023   HGB 16.9 08/18/2023   HCT 49.9 08/18/2023   PLT 252 08/18/2023   GLUCOSE 84 08/18/2023   CHOL 230 (H) 08/18/2023   TRIG 127 08/18/2023   HDL 39 (L) 08/18/2023   LDLCALC 168 (H) 08/18/2023   ALT 163 (H) 08/18/2023   AST 71 (H) 08/18/2023   NA 138 08/18/2023   K 4.7 08/18/2023   CL 101 08/18/2023   CREATININE 0.92 08/18/2023   BUN 14 08/18/2023   CO2 21 08/18/2023   TSH 1.320 08/18/2023   HGBA1C 5.4 08/18/2023      Assessment & Plan:  There are no diagnoses linked to this encounter.   There is no height or weight on file to calculate BMI.   These are the goals we discussed:  Goals   None      This is a list of the screening recommended for you and due dates:  Health Maintenance  Topic Date Due   HIV Screening  Never done   DTaP/Tdap/Td vaccine (1 - Tdap) Never done   Hepatitis B Vaccine (1 of 3 - 19+ 3-dose series) Never done   HPV Vaccine (1 - 3-dose SCDM series) Never done   COVID-19 Vaccine (4 - 2024-25 season) 08/03/2023   Flu Shot  07/02/2024   Hepatitis C Screening  Completed   Meningitis B Vaccine  Aged Out     No orders of the defined types were placed in this encounter.    Follow-up: No follow-ups on file.  An After Visit Summary was printed and given to the patient.  Nola Angles, [place] [facility] [phone]

## 2024-06-16 ENCOUNTER — Ambulatory Visit (INDEPENDENT_AMBULATORY_CARE_PROVIDER_SITE_OTHER): Admitting: Physician Assistant

## 2024-06-16 ENCOUNTER — Other Ambulatory Visit: Payer: Self-pay | Admitting: Physician Assistant

## 2024-06-16 ENCOUNTER — Encounter: Payer: Self-pay | Admitting: Physician Assistant

## 2024-06-16 ENCOUNTER — Telehealth: Payer: Self-pay

## 2024-06-16 VITALS — BP 108/70 | HR 96 | Temp 97.8°F | Resp 16 | Ht 71.0 in | Wt 233.4 lb

## 2024-06-16 DIAGNOSIS — E7800 Pure hypercholesterolemia, unspecified: Secondary | ICD-10-CM

## 2024-06-16 DIAGNOSIS — Z0000 Encounter for general adult medical examination without abnormal findings: Secondary | ICD-10-CM | POA: Insufficient documentation

## 2024-06-16 DIAGNOSIS — I700 Atherosclerosis of aorta: Secondary | ICD-10-CM

## 2024-06-16 DIAGNOSIS — Z23 Encounter for immunization: Secondary | ICD-10-CM

## 2024-06-16 DIAGNOSIS — K760 Fatty (change of) liver, not elsewhere classified: Secondary | ICD-10-CM

## 2024-06-16 DIAGNOSIS — R0683 Snoring: Secondary | ICD-10-CM | POA: Diagnosis not present

## 2024-06-16 DIAGNOSIS — I8391 Asymptomatic varicose veins of right lower extremity: Secondary | ICD-10-CM

## 2024-06-16 DIAGNOSIS — D229 Melanocytic nevi, unspecified: Secondary | ICD-10-CM | POA: Insufficient documentation

## 2024-06-16 LAB — CMP14+EGFR
ALT: 90 IU/L — ABNORMAL HIGH (ref 0–44)
AST: 48 IU/L — ABNORMAL HIGH (ref 0–40)
Albumin: 4.6 g/dL (ref 4.1–5.1)
Alkaline Phosphatase: 86 IU/L (ref 44–121)
BUN/Creatinine Ratio: 16 (ref 9–20)
BUN: 13 mg/dL (ref 6–24)
Bilirubin Total: 0.9 mg/dL (ref 0.0–1.2)
CO2: 21 mmol/L (ref 20–29)
Calcium: 9.4 mg/dL (ref 8.7–10.2)
Chloride: 105 mmol/L (ref 96–106)
Creatinine, Ser: 0.79 mg/dL (ref 0.76–1.27)
Globulin, Total: 2.5 g/dL (ref 1.5–4.5)
Glucose: 86 mg/dL (ref 70–99)
Potassium: 4.8 mmol/L (ref 3.5–5.2)
Sodium: 140 mmol/L (ref 134–144)
Total Protein: 7.1 g/dL (ref 6.0–8.5)
eGFR: 114 mL/min/1.73 (ref >59)

## 2024-06-16 LAB — CBC WITH DIFFERENTIAL/PLATELET
Basophils Absolute: 0 x10E3/uL (ref 0.0–0.2)
Basos: 1 %
EOS (ABSOLUTE): 0.1 x10E3/uL (ref 0.0–0.4)
Eos: 2 %
Hematocrit: 48.6 % (ref 37.5–51.0)
Hemoglobin: 16.4 g/dL (ref 13.0–17.7)
Immature Grans (Abs): 0 x10E3/uL (ref 0.0–0.1)
Immature Granulocytes: 0 %
Lymphocytes Absolute: 2.8 x10E3/uL (ref 0.7–3.1)
Lymphs: 57 %
MCH: 31.5 pg (ref 26.6–33.0)
MCHC: 33.7 g/dL (ref 31.5–35.7)
MCV: 94 fL (ref 79–97)
Monocytes Absolute: 0.6 x10E3/uL (ref 0.1–0.9)
Monocytes: 11 %
Neutrophils Absolute: 1.4 x10E3/uL (ref 1.4–7.0)
Neutrophils: 29 %
Platelets: 234 x10E3/uL (ref 150–450)
RBC: 5.2 x10E6/uL (ref 4.14–5.80)
RDW: 12.2 % (ref 11.6–15.4)
WBC: 4.9 x10E3/uL (ref 3.4–10.8)

## 2024-06-16 LAB — LIPID PANEL
Chol/HDL Ratio: 5.8 ratio — ABNORMAL HIGH (ref 0.0–5.0)
Cholesterol, Total: 202 mg/dL — ABNORMAL HIGH (ref 100–199)
HDL: 35 mg/dL — ABNORMAL LOW (ref >39)
LDL Chol Calc (NIH): 147 mg/dL — ABNORMAL HIGH (ref 0–99)
Triglycerides: 108 mg/dL (ref 0–149)
VLDL Cholesterol Cal: 20 mg/dL (ref 5–40)

## 2024-06-16 LAB — HEMOGLOBIN A1C
Est. average glucose Bld gHb Est-mCnc: 103 mg/dL
Hgb A1c MFr Bld: 5.2 % (ref 4.8–5.6)

## 2024-06-16 MED ORDER — TIRZEPATIDE-WEIGHT MANAGEMENT 2.5 MG/0.5ML ~~LOC~~ SOLN: 2.5000 mg | SUBCUTANEOUS | 0 refills | Status: DC

## 2024-06-16 MED ORDER — ATORVASTATIN CALCIUM 10 MG PO TABS: 10.0000 mg | ORAL_TABLET | Freq: Every day | ORAL | 3 refills | Status: DC

## 2024-06-16 MED ORDER — ZEPBOUND 2.5 MG/0.5ML ~~LOC~~ SOAJ
2.5000 mg | SUBCUTANEOUS | 0 refills | Status: DC
Start: 2024-06-16 — End: 2024-07-21

## 2024-06-16 NOTE — Assessment & Plan Note (Signed)
 Symptoms suggestive of OSA. Home sleep study discussed for confirmation. Consideration of tirzepatide  for weight loss if diagnosis confirmed. - Order home sleep study.

## 2024-06-16 NOTE — Progress Notes (Signed)
 Subjective:  Patient ID: Mario Cooper, male    DOB: 09-19-1981  Age: 43 y.o. MRN: [id number]  Chief Complaint  Patient presents with   Annual Exam    HPI:  Discussed the use of AI scribe software for clinical note transcription with the patient, who gave verbal consent to proceed.  History of Present Illness   Mario Cooper is a 43 year old male who presents for an annual physical exam and blood work.He has a history of fatty liver, previously managed with weight loss, which normalized his liver enzymes. He has undergone ultrasounds in the past.He has a spot on his leg that initially appeared as a pimple but now resembles a bruise with mole-like edges. This spot has been present for several months, and he plans to see a dermatologist for further evaluation.He has a history of elbow pain, previously treated with a cortisone shot 20 years ago, which has not caused issues until recently. He experiences mild discomfort in the mornings, which resolves after stretching. No current pain in the elbow.He is interested in weight loss options and has researched tirzepatide . His snoring, which his wife associates with possible sleep apnea, improves with weight loss. He has a history of significant weight fluctuations, with a current weight of approximately 232 pounds, down from 242 pounds in September.He experiences seasonal allergies, for which he takes Xyzal  as needed, particularly around activities like mowing the lawn. He also uses Flonase occasionally. Xyzal  can make him feel groggy if taken too late in the day. 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 It occasionally feels warm, but he has not experienced any significant changes or pain associated with it.          06/16/2024    8:33 AM 08/11/2023    1:50 PM  Depression screen PHQ 2/9  Decreased Interest 0 0  Down, Depressed, Hopeless 0  0  PHQ - 2 Score 0 0  Altered sleeping 0 0  Tired, decreased energy 2 1  Change in appetite 1 1  Feeling bad or failure about yourself  0 0  Trouble concentrating 0 0  Moving slowly or fidgety/restless 0 0  Suicidal thoughts 0 0  PHQ-9 Score 3 2  Difficult doing work/chores Not difficult at all         06/16/2024    8:33 AM  Fall Risk   Falls in the past year? 0  Number falls in past yr: 0  Injury with Fall? 0  Risk for fall due to : No Fall Risks  Follow up Falls evaluation completed    Patient Care Team: Milon Cleaves, [place] as PCP - General (Physician Assistant)   Review of Systems  Performed and documented in physical exam Current Outpatient Medications on File Prior to Visit  Medication Sig Dispense Refill   levocetirizine (XYZAL ) 5 MG tablet TAKE 1 TABLET BY MOUTH EVERY DAY IN THE EVENING 90 tablet 1   Multiple Vitamin (MULTIVITAMIN WITH MINERALS) TABS tablet Take 1 tablet by mouth daily.     Omega-3 Fatty Acids (FISH OIL) 500 MG CAPS Take 500 mg by mouth daily.     No current facility-administered medications on file prior to visit.   Past Medical History:  Diagnosis Date   Allergy    Encounter for annual physical exam 06/16/2024   Fatty liver    High cholesterol 06/16/2024   History  of hiatal hernia    Kidney stones    Osteoarthritis 06/27/2023   Umbilical hernia 11/05/2019   Past Surgical History:  Procedure Laterality Date   DISTAL BICEPS TENDON REPAIR Left 06/01/2020   Procedure: DISTAL BICEPS TENDON REPAIR;  Surgeon: Carolee Lynwood JINNY DOUGLAS, MD;  Location: [facility] OR;  Service: Orthopedics;  Laterality: Left;    HERNIA REPAIR  11/05/2019   umbilical and hiatal    Family History  Problem Relation Age of Onset   Obesity Father    Arthritis Maternal Grandfather    Social History   Socioeconomic History   Marital status: Married    Spouse name: Not on file   Number of children: Not on file   Years of education: Not on file   Highest education level:  12th grade  Occupational History   Not on file  Tobacco Use   Smoking status: Never   Smokeless tobacco: Never  Vaping Use   Vaping status: Never Used  Substance and Sexual Activity   Alcohol use: Never   Drug use: Never   Sexual activity: Yes    Birth control/protection: Condom  Other Topics Concern   Not on file  Social History Narrative   Not on file   Social Drivers of Health   Financial Resource Strain: Low Risk  (06/16/2024)   Overall Financial Resource Strain (CARDIA)    Difficulty of Paying Living Expenses: Not hard at all  Food Insecurity: No Food Insecurity (06/16/2024)   Hunger Vital Sign    Worried About Running Out of Food in the Last Year: Never true    Ran Out of Food in the Last Year: Never true  Transportation Needs: No Transportation Needs (06/16/2024)   PRAPARE - Administrator, Civil Service (Medical): No    Lack of Transportation (Non-Medical): No  Physical Activity: Insufficiently Active (06/16/2024)   Exercise Vital Sign    Days of Exercise per Week: 3 days    Minutes of Exercise per Session: 20 min  Stress: No Stress Concern Present (06/16/2024)   [employer] of Occupational Health - Occupational Stress Questionnaire    Feeling of Stress: Not at all  Social Connections: Moderately Integrated (06/16/2024)   Social Connection and Isolation Panel    Frequency of Communication with Friends and Family: More than three times a week    Frequency of Social Gatherings with Friends and Family: Once a week    Attends Religious Services: More than 4 times per year    Active Member of [employer] or Organizations: No    Attends Engineer, structural: Not on file    Marital Status: Married    Objective:  BP 108/70   Pulse 96   Temp 97.8 F (36.6 C) (Temporal)   Resp 16   Ht 5' 11 (1.803 m)   Wt 233 lb 6.4 oz (105.9 kg)   SpO2 98%   BMI 32.55 kg/m      06/16/2024    8:29 AM 08/11/2023    1:48 PM 04/25/2023   10:19 AM  BP/Weight   Systolic BP 108 130 136  Diastolic BP 70 78 99  Wt. (Lbs) 233.4 242.2   BMI 32.55 kg/m2 33.78 kg/m2     Physical Exam  Performed and documented in physical exam note    Media Information  Document Information  Photos    06/16/2024 08:54  Attached To:  Office Visit on 06/16/24 with Milon Cleaves, PA  Source Information  [place], [place], [place]  [facility]        Lab Results  Component Value Date   WBC 4.1 08/18/2023   HGB 16.9 08/18/2023   HCT 49.9 08/18/2023   PLT 252 08/18/2023   GLUCOSE 84 08/18/2023   CHOL 230 (H) 08/18/2023   TRIG 127 08/18/2023   HDL 39 (L) 08/18/2023   LDLCALC 168 (H) 08/18/2023   ALT 163 (H) 08/18/2023   AST 71 (H) 08/18/2023   NA 138 08/18/2023   K 4.7 08/18/2023   CL 101 08/18/2023   CREATININE 0.92 08/18/2023   BUN 14 08/18/2023   CO2 21 08/18/2023   TSH 1.320 08/18/2023   HGBA1C 5.4 08/18/2023      Assessment & Plan:   General Health Maintenance Routine physical examination and blood work. Hepatitis B and tetanus vaccinations recommended due to low antibody levels and exposure risk. - Administer hepatitis B vaccine (2-part series). - Administer tetanus vaccine.  Follow-up Follow-up needed to assess response to new medications and evaluate conditions. Insurance approval for tirzepatide  discussed. - Schedule follow-up appointment in 3 months. - Perform lab tests prior to follow-up appointment. Encounter for annual physical exam  Fatty liver -     Lipid panel -     Tirzepatide -Weight Management; Inject 2.5 mg into the skin once a week.  Dispense: 2 mL; Refill: 0 -     Atorvastatin  Calcium ; Take 1 tablet (10 mg total) by mouth daily.  Dispense: 90 tablet; Refill: 3  Atherosclerosis of aorta (HCC) -     CBC with Differential/Platelet -     CMP14+EGFR -     Hemoglobin A1c -     Tirzepatide -Weight Management; Inject 2.5 mg into the skin once a week.  Dispense: 2 mL; Refill: 0 -     Atorvastatin  Calcium ; Take 1 tablet  (10 mg total) by mouth daily.  Dispense: 90 tablet; Refill: 3  High cholesterol -     Tirzepatide -Weight Management; Inject 2.5 mg into the skin once a week.  Dispense: 2 mL; Refill: 0 -     Atorvastatin  Calcium ; Take 1 tablet (10 mg total) by mouth daily.  Dispense: 90 tablet; Refill: 3  Snoring -     Home sleep test  Change in skin mole -     Ambulatory referral to Dermatology     Meds ordered this encounter  Medications   tirzepatide  (ZEPBOUND ) 2.5 MG/0.5ML injection vial    Sig: Inject 2.5 mg into the skin once a week.    Dispense:  2 mL    Refill:  0   atorvastatin  (LIPITOR) 10 MG tablet    Sig: Take 1 tablet (10 mg total) by mouth daily.    Dispense:  90 tablet    Refill:  3    Orders Placed This Encounter  Procedures   CBC with Differential/Platelet   CMP14+EGFR   Lipid panel   Hemoglobin A1c   Ambulatory referral to Dermatology   Home sleep test     Follow-up: Return in about 3 months (around 09/16/2024) for Chronic, Nola, lab visit.   I,Charletta Voight,acting as a Neurosurgeon for [employer], PA.,have documented all relevant documentation on the behalf of Nola Angles, PA,as directed by  Nola Angles, PA while in the presence of Nola Angles, [place].   An After Visit Summary was printed and given to the patient.  Nola Angles, [place] [facility] [phone]

## 2024-06-16 NOTE — Assessment & Plan Note (Signed)
 Fatty liver with elevated liver enzymes. Repatha discussed for cholesterol and fatty liver management if statin tolerated. - Monitor liver enzyme levels. - Consider Repatha if statin is tolerated and effective.

## 2024-06-16 NOTE — Assessment & Plan Note (Signed)
 Picture added to chart Sent referral to dermatology Will also ask about varicose vein.

## 2024-06-16 NOTE — Telephone Encounter (Signed)
 Copied from CRM [id number]. Topic: Clinical - Prescription Issue >> Jun 16, 2024 10:12 AM Charlet HERO wrote: Reason for CRM: Patient is calling because the pharmacy is stating that tirzepatide  (ZEPBOUND ) 2.5 MG/0.5ML injection vial they are telling him that they do not have the vials and would need have a script for the pen. Patient would llike to have call back when completed

## 2024-06-16 NOTE — Assessment & Plan Note (Signed)
 CT scan indicates aortic atherosclerosis. Elevated cholesterol and BMI increase cardiovascular risk. Statin therapy discussed. Repatha considered if statin tolerated. - Initiate statin therapy at a low dose. - Monitor cholesterol levels. - Consider Repatha if statin is tolerated and effective.

## 2024-06-16 NOTE — Patient Instructions (Signed)
 VISIT SUMMARY:  Today, you came in for your annual physical exam and blood work. We discussed several health concerns, including your history of fatty liver, a spot on your leg, elbow pain, weight loss options, snoring, seasonal allergies, and a varicose vein on your leg.  YOUR PLAN:  -OBSTRUCTIVE SLEEP APNEA (OSA): Your symptoms suggest you might have obstructive sleep apnea, a condition where your breathing stops and starts during sleep. We will arrange a home sleep study to confirm this. If diagnosed, we can consider tirzepatide  for weight loss, which may help improve your symptoms.  -AORTIC ATHEROSCLEROSIS: A CT scan shows you have aortic atherosclerosis, which is a buildup of fats and cholesterol in the artery walls. This increases your risk for heart disease. We will start you on a low-dose statin to manage your cholesterol levels and monitor your progress. If the statin works well, we might consider adding Repatha.  -NONALCOHOLIC FATTY LIVER DISEASE (NAFLD): You have a history of fatty liver, which is the buildup of fat in the liver not caused by alcohol. We will monitor your liver enzyme levels and consider adding Repatha if the statin is effective and well-tolerated.  -VARICOSE VEINS: You have a varicose vein on your leg that sometimes feels warm and becomes more prominent with activity. We will refer you to a dermatologist to evaluate the spot on your leg and consider a referral to a vein specialist if your symptoms persist.  -ALLERGIC RHINITIS: You have seasonal allergies that cause nasal congestion. Continue using Xyzal  and Flonase as needed. If your symptoms persist, you can consider taking Benadryl at night.  -GENERAL HEALTH MAINTENANCE: We performed a routine physical examination and blood work. Due to low antibody levels and exposure risk, we recommend getting the hepatitis B and tetanus vaccines. We will administer the hepatitis B vaccine as a 2-part series and the tetanus vaccine  today.  INSTRUCTIONS:  Please schedule a follow-up appointment in 3 months to assess your response to the new medications and evaluate your conditions. Make sure to complete the lab tests before your follow-up appointment. We will also need to get insurance approval for tirzepatide .

## 2024-06-16 NOTE — Assessment & Plan Note (Signed)
 Uncontrolled Will start taking Lipitor 10mg  Will add on Repatha if he is not having side effects with Lipitor due to fatty liver Labs drawn today Lab Results  Component Value Date   LDLCALC 168 (H) 08/18/2023

## 2024-06-16 NOTE — Assessment & Plan Note (Signed)
 Leg knot suggestive of varicose vein with phlebitis. Referral to vein specialist discussed if symptoms persist. Dermatology referral for leg lesion evaluation. - Refer to dermatologist for evaluation. - Consider referral to vein specialist if needed.

## 2024-06-16 NOTE — Addendum Note (Signed)
 Addended by: Lalia Loudon K on: 06/16/2024 10:54 AM   Modules accepted: Orders

## 2024-06-20 ENCOUNTER — Ambulatory Visit: Payer: Self-pay | Admitting: Physician Assistant

## 2024-06-23 ENCOUNTER — Other Ambulatory Visit: Payer: Self-pay

## 2024-06-23 DIAGNOSIS — R0683 Snoring: Secondary | ICD-10-CM

## 2024-06-24 DIAGNOSIS — G4733 Obstructive sleep apnea (adult) (pediatric): Secondary | ICD-10-CM | POA: Diagnosis not present

## 2024-06-24 DIAGNOSIS — R0602 Shortness of breath: Secondary | ICD-10-CM | POA: Diagnosis not present

## 2024-06-27 DIAGNOSIS — R0602 Shortness of breath: Secondary | ICD-10-CM | POA: Diagnosis not present

## 2024-06-27 DIAGNOSIS — G4733 Obstructive sleep apnea (adult) (pediatric): Secondary | ICD-10-CM | POA: Diagnosis not present

## 2024-06-29 ENCOUNTER — Other Ambulatory Visit: Payer: Self-pay

## 2024-06-29 DIAGNOSIS — K760 Fatty (change of) liver, not elsewhere classified: Secondary | ICD-10-CM

## 2024-06-29 DIAGNOSIS — I700 Atherosclerosis of aorta: Secondary | ICD-10-CM

## 2024-06-29 DIAGNOSIS — E7800 Pure hypercholesterolemia, unspecified: Secondary | ICD-10-CM

## 2024-07-05 ENCOUNTER — Other Ambulatory Visit: Payer: Self-pay

## 2024-07-05 ENCOUNTER — Encounter: Payer: Self-pay | Admitting: Physician Assistant

## 2024-07-05 DIAGNOSIS — I700 Atherosclerosis of aorta: Secondary | ICD-10-CM

## 2024-07-05 DIAGNOSIS — R0683 Snoring: Secondary | ICD-10-CM

## 2024-07-05 DIAGNOSIS — K760 Fatty (change of) liver, not elsewhere classified: Secondary | ICD-10-CM

## 2024-07-05 DIAGNOSIS — E7800 Pure hypercholesterolemia, unspecified: Secondary | ICD-10-CM

## 2024-07-05 MED ORDER — ATORVASTATIN CALCIUM 10 MG PO TABS: 10.0000 mg | ORAL_TABLET | Freq: Every day | ORAL | 3 refills | Status: AC

## 2024-07-05 NOTE — Telephone Encounter (Unsigned)
 Copied from CRM [id number]. Topic: Clinical - Prescription Issue >> Jul 02, 2024  4:50 PM Nathanel BROCKS wrote: Reason for CRM:   atorvastatin  (LIPITOR) 10 MG tablet 90 day supply.  Rx was sent to [facility] but they can not fill for insurance reasons.  [place], [facility] called and requested a 90 day prescription for this  medication for this pt. They will cover under his ins plan.  Fax [phone] Phone [phone]

## 2024-07-12 DIAGNOSIS — G4733 Obstructive sleep apnea (adult) (pediatric): Secondary | ICD-10-CM | POA: Diagnosis not present

## 2024-07-20 DIAGNOSIS — L810 Postinflammatory hyperpigmentation: Secondary | ICD-10-CM | POA: Diagnosis not present

## 2024-07-20 DIAGNOSIS — B079 Viral wart, unspecified: Secondary | ICD-10-CM | POA: Diagnosis not present

## 2024-07-20 DIAGNOSIS — L918 Other hypertrophic disorders of the skin: Secondary | ICD-10-CM | POA: Diagnosis not present

## 2024-07-21 ENCOUNTER — Other Ambulatory Visit: Payer: Self-pay

## 2024-07-21 DIAGNOSIS — E66811 Obesity, class 1: Secondary | ICD-10-CM

## 2024-07-21 MED ORDER — TIRZEPATIDE-WEIGHT MANAGEMENT 5 MG/0.5ML ~~LOC~~ SOAJ: 5.0000 mg | SUBCUTANEOUS | 0 refills | Status: DC

## 2024-08-03 ENCOUNTER — Telehealth: Admitting: Physician Assistant

## 2024-08-03 DIAGNOSIS — J019 Acute sinusitis, unspecified: Secondary | ICD-10-CM

## 2024-08-03 DIAGNOSIS — B9689 Other specified bacterial agents as the cause of diseases classified elsewhere: Secondary | ICD-10-CM | POA: Diagnosis not present

## 2024-08-03 MED ORDER — DOXYCYCLINE HYCLATE 100 MG PO TABS: 100.0000 mg | ORAL_TABLET | Freq: Two times a day (BID) | ORAL | 0 refills | Status: DC

## 2024-08-03 NOTE — Progress Notes (Signed)

## 2024-08-03 NOTE — Progress Notes (Signed)
 I have spent 5 minutes in review of e-visit questionnaire, review and updating patient chart, medical decision making and response to patient.   Elsie Velma Lunger, PA-C

## 2024-08-12 DIAGNOSIS — G4733 Obstructive sleep apnea (adult) (pediatric): Secondary | ICD-10-CM | POA: Diagnosis not present

## 2024-08-13 DIAGNOSIS — R0981 Nasal congestion: Secondary | ICD-10-CM | POA: Diagnosis not present

## 2024-08-13 DIAGNOSIS — R051 Acute cough: Secondary | ICD-10-CM | POA: Diagnosis not present

## 2024-08-13 DIAGNOSIS — R0982 Postnasal drip: Secondary | ICD-10-CM | POA: Diagnosis not present

## 2024-08-17 ENCOUNTER — Other Ambulatory Visit: Payer: Self-pay | Admitting: Physician Assistant

## 2024-08-17 ENCOUNTER — Other Ambulatory Visit: Payer: Self-pay

## 2024-08-17 DIAGNOSIS — E66811 Obesity, class 1: Secondary | ICD-10-CM

## 2024-08-17 MED ORDER — TIRZEPATIDE-WEIGHT MANAGEMENT 7.5 MG/0.5ML ~~LOC~~ SOAJ: 7.5000 mg | SUBCUTANEOUS | 0 refills | Status: DC

## 2024-08-17 MED ORDER — TIRZEPATIDE-WEIGHT MANAGEMENT 7.5 MG/0.5ML ~~LOC~~ SOLN: 7.5000 mg | SUBCUTANEOUS | 0 refills | Status: DC

## 2024-08-19 ENCOUNTER — Ambulatory Visit: Admitting: Family Medicine

## 2024-08-19 ENCOUNTER — Encounter: Payer: Self-pay | Admitting: Family Medicine

## 2024-08-19 VITALS — BP 110/70 | HR 93 | Temp 98.7°F | Ht 71.0 in | Wt 223.2 lb

## 2024-08-19 DIAGNOSIS — R051 Acute cough: Secondary | ICD-10-CM

## 2024-08-19 DIAGNOSIS — H6693 Otitis media, unspecified, bilateral: Secondary | ICD-10-CM

## 2024-08-19 DIAGNOSIS — J329 Chronic sinusitis, unspecified: Secondary | ICD-10-CM

## 2024-08-19 MED ORDER — AZITHROMYCIN 250 MG PO TABS: ORAL_TABLET | ORAL | 0 refills | Status: AC

## 2024-08-19 MED ORDER — GUAIFENESIN-CODEINE 100-10 MG/5ML PO SOLN: 5.0000 mL | Freq: Three times a day (TID) | ORAL | 0 refills | Status: DC | PRN

## 2024-08-19 NOTE — Progress Notes (Unsigned)
 Acute Office Visit  Subjective:    Patient ID: Mario Cooper, male    DOB: 13-Dec-1980, 43 y.o.   MRN: [id number]  Chief Complaint  Patient presents with  . Sinusitis  . Nasal Congestion  . Cough   Discussed the use of AI scribe software for clinical note transcription with the patient, who gave verbal consent to proceed.  History of Present Illness   Mario Cooper is a 43 year old male who presents with persistent respiratory symptoms and sinus pressure.  Upper respiratory symptoms - Persistent dry cough for three weeks, initially productive with greenish-yellow mucus that cleared but never fully resolved - Significant sinus pressure radiating to teeth and ears, with pressure across the face - Mild fevers present - No significant ear pain, but facial pressure noted - No sore throat except during episodes of major drainage - Difficulty taking deep breaths - No shortness of breath or wheezing  Treatment history and medication use - Current medications include Tessalon Perles for cough, guaifenesin , and Flonase nasal spray - Use of Sudafed and prednisone provided temporary relief; prednisone increased energy but did not resolve symptoms - Previously prescribed doxycycline  and azithromycin  (Z-Pak), both tolerated well - Penicillin allergy; has not taken Singulair due to concerns about side effects  Recurrent sinus infections - History of seasonal sinus infections typically resolved with azithromycin  (Z-Pak) - Current episode more severe and persistent than prior infections  Cpap use - Initiated CPAP therapy two weeks prior to symptom onset - CPAP machine maintained with regular cleaning  Impact on daily functioning - Symptoms causing frustration and negatively impacting daily life, including work as a Naval architect      Past Medical History:  Diagnosis Date  . Allergy   . Encounter for annual physical exam 06/16/2024  . Fatty liver   . High cholesterol 06/16/2024  . History of  hiatal hernia   . Kidney stones   . Osteoarthritis 06/27/2023  . Umbilical hernia 11/05/2019    Past Surgical History:  Procedure Laterality Date  . DISTAL BICEPS TENDON REPAIR Left 06/01/2020   Procedure: DISTAL BICEPS TENDON REPAIR;  Surgeon: Carolee Lynwood JINNY DOUGLAS, MD;  Location: [facility] OR;  Service: Orthopedics;  Laterality: Left;   . HERNIA REPAIR  11/05/2019   umbilical and hiatal    Family History  Problem Relation Age of Onset  . Obesity Father   . Arthritis Maternal Grandfather     Social History   Socioeconomic History  . Marital status: Married    Spouse name: Not on file  . Number of children: Not on file  . Years of education: Not on file  . Highest education level: 12th grade  Occupational History  . Not on file  Tobacco Use  . Smoking status: Never  . Smokeless tobacco: Never  Vaping Use  . Vaping status: Never Used  Substance and Sexual Activity  . Alcohol use: Never  . Drug use: Never  . Sexual activity: Yes    Birth control/protection: Condom  Other Topics Concern  . Not on file  Social History Narrative  . Not on file   Social Drivers of Health   Financial Resource Strain: Low Risk  (06/16/2024)   Overall Financial Resource Strain (CARDIA)   . Difficulty of Paying Living Expenses: Not hard at all  Food Insecurity: No Food Insecurity (06/16/2024)   Hunger Vital Sign   . Worried About Programme researcher, broadcasting/film/video in the Last Year: Never true   . Ran Out  of Food in the Last Year: Never true  Transportation Needs: No Transportation Needs (06/16/2024)   PRAPARE - Transportation   . Lack of Transportation (Medical): No   . Lack of Transportation (Non-Medical): No  Physical Activity: Insufficiently Active (06/16/2024)   Exercise Vital Sign   . Days of Exercise per Week: 3 days   . Minutes of Exercise per Session: 20 min  Stress: No Stress Concern Present (06/16/2024)   [employer] of Occupational Health - Occupational Stress Questionnaire   .  Feeling of Stress: Not at all  Social Connections: Moderately Integrated (06/16/2024)   Social Connection and Isolation Panel   . Frequency of Communication with Friends and Family: More than three times a week   . Frequency of Social Gatherings with Friends and Family: Once a week   . Attends Religious Services: More than 4 times per year   . Active Member of Clubs or Organizations: No   . Attends Banker Meetings: Not on file   . Marital Status: Married  Catering manager Violence: Not At Risk (08/11/2023)   Humiliation, Afraid, Rape, and Kick questionnaire   . Fear of Current or Ex-Partner: No   . Emotionally Abused: No   . Physically Abused: No   . Sexually Abused: No    Outpatient Medications Prior to Visit  Medication Sig Dispense Refill  . atorvastatin  (LIPITOR) 10 MG tablet Take 1 tablet (10 mg total) by mouth daily. 90 tablet 3  . benzonatate (TESSALON) 100 MG capsule Take 200 mg by mouth every 8 (eight) hours as needed.    SABRA levocetirizine (XYZAL ) 5 MG tablet TAKE 1 TABLET BY MOUTH EVERY DAY IN THE EVENING 90 tablet 1  . Multiple Vitamin (MULTIVITAMIN WITH MINERALS) TABS tablet Take 1 tablet by mouth daily.    . Omega-3 Fatty Acids (FISH OIL) 500 MG CAPS Take 500 mg by mouth daily.    . tirzepatide  (ZEPBOUND ) 7.5 MG/0.5ML Pen Inject 7.5 mg into the skin once a week. 2 mL 0  . doxycycline  (VIBRA -TABS) 100 MG tablet Take 1 tablet (100 mg total) by mouth 2 (two) times daily. 20 tablet 0   No facility-administered medications prior to visit.    Allergies  Allergen Reactions  . Penicillins Other (See Comments)    Childhood reaction    Review of Systems  Constitutional:  Positive for fatigue.  HENT:  Positive for congestion and sinus pressure.   Eyes: Negative.   Respiratory:  Positive for cough.   Cardiovascular: Negative.   Gastrointestinal:  Negative for constipation, diarrhea, nausea and vomiting.  Endocrine: Negative.   Genitourinary:  Negative for  decreased urine volume, dysuria and frequency.  Musculoskeletal: Negative.   Skin: Negative.   Allergic/Immunologic: Negative.   Neurological:  Positive for headaches. Negative for dizziness and light-headedness.  Hematological: Negative.   Psychiatric/Behavioral: Negative.         Objective:        06/16/2024    8:29 AM 08/11/2023    1:48 PM 04/25/2023   10:19 AM  Vitals with BMI  Height 5' 11 5' 11   Weight 233 lbs 6 oz 242 lbs 3 oz   BMI 32.57 33.79   Systolic 108 130 863  Diastolic 70 78 99  Pulse 96 71 83    No data found.   Physical Exam Constitutional:      General: He is not in acute distress.    Appearance: Normal appearance.  Cardiovascular:     Rate and Rhythm:  Normal rate and regular rhythm.     Heart sounds: Normal heart sounds. No murmur heard. Pulmonary:     Effort: Pulmonary effort is normal.     Breath sounds: Normal breath sounds. No wheezing.  Abdominal:     General: Bowel sounds are normal.     Palpations: Abdomen is soft.     Tenderness: There is no abdominal tenderness.  Musculoskeletal:        General: Normal range of motion.  Neurological:     Mental Status: He is alert. Mental status is at baseline.  Psychiatric:        Mood and Affect: Mood normal.        Behavior: Behavior normal.     Health Maintenance Due  Topic Date Due  . HIV Screening  Never done  . HPV VACCINES (1 - 3-dose SCDM series) Never done  . Influenza Vaccine  07/02/2024  . Hepatitis B Vaccines 19-59 Average Risk (2 of 2 - CpG 2-dose series) 07/14/2024  . COVID-19 Vaccine (4 - 2025-26 season) 08/02/2024       Topic Date Due  . HPV VACCINES (1 - 3-dose SCDM series) Never done  . Hepatitis B Vaccines 19-59 Average Risk (2 of 2 - CpG 2-dose series) 07/14/2024     Lab Results  Component Value Date   TSH 1.320 08/18/2023   Lab Results  Component Value Date   WBC 4.9 06/16/2024   HGB 16.4 06/16/2024   HCT 48.6 06/16/2024   MCV 94 06/16/2024   PLT 234  06/16/2024   Lab Results  Component Value Date   NA 140 06/16/2024   K 4.8 06/16/2024   CO2 21 06/16/2024   GLUCOSE 86 06/16/2024   BUN 13 06/16/2024   CREATININE 0.79 06/16/2024   BILITOT 0.9 06/16/2024   ALKPHOS 86 06/16/2024   AST 48 (H) 06/16/2024   ALT 90 (H) 06/16/2024   PROT 7.1 06/16/2024   ALBUMIN 4.6 06/16/2024   CALCIUM  9.4 06/16/2024   ANIONGAP 8 04/25/2023   EGFR 114 06/16/2024   Lab Results  Component Value Date   CHOL 202 (H) 06/16/2024   Lab Results  Component Value Date   HDL 35 (L) 06/16/2024   Lab Results  Component Value Date   LDLCALC 147 (H) 06/16/2024   Lab Results  Component Value Date   TRIG 108 06/16/2024   Lab Results  Component Value Date   CHOLHDL 5.8 (H) 06/16/2024   Lab Results  Component Value Date   HGBA1C 5.2 06/16/2024       Assessment & Plan:  There are no diagnoses linked to this encounter.   There is no height or weight on file to calculate BMI.SABRA  Assessment and Plan   Assessment and Plan    Acute sinusitis with persistent symptoms and bilateral acute otitis media Persistent sinusitis and bilateral otitis media despite doxycycline  and prednisone. Lungs clear. Possible viral etiology. Azithromycin  chosen due to penicillin allergy and previous tolerance. - Prescribe azithromycin  (Z-Pak). - Prescribe guaifenesin  with codeine  for nighttime cough. - Advise increased fluid intake. - Recommend changing toothbrush post-antibiotics. - Encourage vitamin C, zinc, elderberry, and turmeric for immunity. - Refer to [facility] for testing.  Allergic rhinitis Chronic allergic rhinitis with seasonal exacerbations. Declined Singulair due to psychological side effects. - Refer to [facility] for testing and management.  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 Interest in confirming allergy status. - Discuss potential penicillin allergy testing.  No orders of the defined types were  placed in this encounter.   No orders of the defined types were placed in this encounter.    Follow-up: No follow-ups on file.  An After Visit Summary was printed and given to the patient.  Harrie CHRISTELLA Cedar, FNP [facility] [phone]

## 2024-08-19 NOTE — Patient Instructions (Signed)
  VISIT SUMMARY: During your visit, we discussed your persistent respiratory symptoms and sinus pressure, which have been affecting your daily life. We reviewed your treatment history and current medications, and we have made some adjustments to help manage your symptoms more effectively.  YOUR PLAN: -ACUTE SINUSITIS WITH PERSISTENT SYMPTOMS AND BILATERAL ACUTE OTITIS MEDIA: You have a sinus infection and ear infection that have not improved with previous treatments. We will start you on azithromycin  (Z-Pak) due to your penicillin allergy and previous tolerance. Additionally, we will prescribe guaifenesin  with codeine  to help with your nighttime cough. Please increase your fluid intake, change your toothbrush after finishing the antibiotics, and consider taking vitamin C, zinc, elderberry, and turmeric to boost your immunity.  -ALLERGIC RHINITIS: You have chronic nasal allergies that get worse during certain seasons. We will refer you to [facility] for further testing and management to help control your symptoms.  -PENICILLIN ALLERGY: You have reported an allergy to penicillin since infancy. We discussed the possibility of testing to confirm whether you are still allergic to penicillin.  INSTRUCTIONS: Please follow up with [facility] for testing and management of your allergic rhinitis. Additionally, consider penicillin allergy testing as discussed.                      Contains text generated by Abridge.                                 Contains text generated by Abridge.

## 2024-08-22 DIAGNOSIS — H6693 Otitis media, unspecified, bilateral: Secondary | ICD-10-CM | POA: Insufficient documentation

## 2024-08-22 DIAGNOSIS — R051 Acute cough: Secondary | ICD-10-CM | POA: Insufficient documentation

## 2024-08-22 DIAGNOSIS — J329 Chronic sinusitis, unspecified: Secondary | ICD-10-CM | POA: Insufficient documentation

## 2024-08-22 NOTE — Assessment & Plan Note (Signed)
 Chronic allergic rhinitis with seasonal exacerbations. Declined Singulair due to psychological side effects. - Refer to [facility] for testing and management.

## 2024-08-22 NOTE — Assessment & Plan Note (Addendum)
 Acute sinusitis with persistent symptoms and bilateral acute otitis media Persistent sinusitis and bilateral otitis media despite doxycycline  and prednisone. Lungs clear. Possible viral etiology. Azithromycin  chosen due to penicillin allergy and previous tolerance. - Prescribe azithromycin  (Z-Pak). - Recommend changing toothbrush post-antibiotics. - Encourage vitamin C, zinc, elderberry, and turmeric for immunity. - Refer to [facility] for testing.

## 2024-08-22 NOTE — Assessment & Plan Note (Signed)
 Acute - Not able to rest adequately for job functioning - Prescribe guaifenesin  with codeine  for nighttime cough. - Advise increased fluid intake. - Throat lozenges and honey and lemon for cough as needed

## 2024-09-15 ENCOUNTER — Encounter: Payer: Self-pay | Admitting: Allergy and Immunology

## 2024-09-15 ENCOUNTER — Ambulatory Visit: Admitting: Allergy and Immunology

## 2024-09-15 VITALS — BP 124/82 | HR 70 | Resp 16 | Ht 69.5 in | Wt 218.2 lb

## 2024-09-15 DIAGNOSIS — J3089 Other allergic rhinitis: Secondary | ICD-10-CM

## 2024-09-15 DIAGNOSIS — B999 Unspecified infectious disease: Secondary | ICD-10-CM

## 2024-09-15 DIAGNOSIS — J301 Allergic rhinitis due to pollen: Secondary | ICD-10-CM | POA: Diagnosis not present

## 2024-09-15 MED ORDER — OLOPATADINE HCL 0.6 % NA SOLN: NASAL | 5 refills | Status: DC

## 2024-09-15 MED ORDER — FLUTICASONE PROPIONATE 50 MCG/ACT NA SUSP: NASAL | 5 refills | Status: AC

## 2024-09-15 NOTE — Patient Instructions (Addendum)
  1. Return to clinic for skin testing (no antihistamines)  2. Treat and prevent inflammation of airway:   A. Fluticasone - 1 spray each nostril 2 times per day  B. Olopatadine - 1 spray each nostril 2 times per day  3. If needed:   A. Xyzal  - 2 mg - 1 tablet 1-2 times per day  B. Nasal saline multiple times per day  C. Pataday - 1 drop each eye 1 time per day  4. Immunotherapy???  5. Evaluation of immune system defect???  6. Influenza = Tamiflu. Covid = Paxlovid

## 2024-09-15 NOTE — Progress Notes (Unsigned)
 [place] - [place] - [place] - [place] - [place]   Dear Mario,  Thank you for referring Mario Cooper to the [facility] [place]  on 09/15/2024.   Below is a summation of this patient's evaluation and recommendations.  Thank you for your referral. I will keep you informed about this patient's response to treatment.   If you have any questions please do not hesitate to contact me.   Sincerely,  Camellia DOROTHA Denis, MD Allergy / Immunology [facility] [place]    ______________________________________________________________________    NEW PATIENT NOTE  Referring Provider: Teressa Harrie HERO, FNP Primary Provider: Milon Cleaves, PA Date of office visit: 09/15/2024    Subjective:   Chief Complaint:  Mario Cooper (DOB: 23-Aug-1981) is a 43 y.o. male who presents to the clinic on 09/15/2024 with a chief complaint of Sinusitis .     HPI: Mario Cooper presents to this clinic in evaluation of recurrent sinus infections.  Since about the age of 51 he has been having recurrent sinus infections with a frequency of 3-4 times per year manifested as nasal congestion and mouth breathing yellow-green nasal discharge and pressure in his head and teeth pain that usually requires an antibiotic.  His last episode started in August 2025 and required 3 antibiotics and a systemic steroid to get under control.  In between these episodes he he does relatively well as long as he continues on Flonase and Xyzal  and avoids cats, which gives rise to eye and nose problems, and avoids significant outdoor exposure especially when mowing the lawn.  He can smell although his smell might be diminished somewhat ever since he contracted COVID.  He does not have any other associated atopic disease or recurrent infections.  He does have a history of sleep apnea for which he uses a CPAP.  He has a history of fatty liver.  Past Medical History:  Diagnosis  Date   Allergy    Encounter for annual physical exam 06/16/2024   Fatty liver    High cholesterol 06/16/2024   History of hiatal hernia    Kidney stones    Osteoarthritis 06/27/2023   Umbilical hernia 11/05/2019    Past Surgical History:  Procedure Laterality Date   DISTAL BICEPS TENDON REPAIR Left 06/01/2020   Procedure: DISTAL BICEPS TENDON REPAIR;  Surgeon: Carolee Lynwood JINNY DOUGLAS, MD;  Location: [facility] OR;  Service: Orthopedics;  Laterality: Left;    HERNIA REPAIR  11/05/2019   umbilical and hiatal    Allergies as of 09/15/2024       Reactions   Penicillins Other (See Comments)   Childhood reaction        Medication List    atorvastatin  10 MG tablet Commonly known as: LIPITOR Take 1 tablet (10 mg total) by mouth daily.   benzonatate 100 MG capsule Commonly known as: TESSALON Take 200 mg by mouth every 8 (eight) hours as needed.   Fish Oil 500 MG Caps Take 500 mg by mouth daily.   fluticasone 50 MCG/ACT nasal spray Commonly known as: FLONASE Use 1 spray in each nostril twice daily   guaiFENesin -codeine  100-10 MG/5ML syrup Take 5 mLs by mouth 3 (three) times daily as needed for cough.   levocetirizine 5 MG tablet Commonly known as: XYZAL  TAKE 1 TABLET BY MOUTH EVERY DAY IN THE EVENING   multivitamin with minerals Tabs tablet Take 1 tablet by mouth daily.   Olopatadine HCl 0.6 % Soln Use  1 spray in each nostril twice daily Started by: Mario Cooper   tirzepatide  7.5 MG/0.5ML Pen Commonly known as: ZEPBOUND  Inject 7.5 mg into the skin once a week.    Review of systems negative except as noted in HPI / PMHx or noted below:  Review of Systems  Constitutional: Negative.   HENT: Negative.    Eyes: Negative.   Respiratory: Negative.    Cardiovascular: Negative.   Gastrointestinal: Negative.   Genitourinary: Negative.   Musculoskeletal: Negative.   Skin: Negative.   Neurological: Negative.   Endo/Heme/Allergies: Negative.    Psychiatric/Behavioral: Negative.      Family History  Problem Relation Age of Onset   Obesity Father    Arthritis Maternal Grandfather     Social History   Socioeconomic History   Marital status: Married    Spouse name: Not on file   Number of children: Not on file   Years of education: Not on file   Highest education level: 12th grade  Occupational History   Not on file  Tobacco Use   Smoking status: Never    Passive exposure: Past (parents smoked while he was growing up)   Smokeless tobacco: Never  Vaping Use   Vaping status: Never Used  Substance and Sexual Activity   Alcohol use: Never   Drug use: Never   Sexual activity: Yes    Birth control/protection: Condom  Other Topics Concern   Not on file  Social History Narrative   Not on file   Social Drivers of Health   Financial Resource Strain: Low Risk  (06/16/2024)   Overall Financial Resource Strain (CARDIA)    Difficulty of Paying Living Expenses: Not hard at all  Food Insecurity: No Food Insecurity (06/16/2024)   Hunger Vital Sign    Worried About Running Out of Food in the Last Year: Never true    Ran Out of Food in the Last Year: Never true  Transportation Needs: No Transportation Needs (06/16/2024)   PRAPARE - Administrator, Civil Service (Medical): No    Lack of Transportation (Non-Medical): No  Physical Activity: Insufficiently Active (06/16/2024)   Exercise Vital Sign    Days of Exercise per Week: 3 days    Minutes of Exercise per Session: 20 min  Stress: No Stress Concern Present (06/16/2024)   [employer] of Occupational Health - Occupational Stress Questionnaire    Feeling of Stress: Not at all  Social Connections: Moderately Integrated (06/16/2024)   Social Connection and Isolation Panel    Frequency of Communication with Friends and Family: More than three times a week    Frequency of Social Gatherings with Friends and Family: Once a week    Attends Religious Services: More  than 4 times per year    Active Member of [employer] or Organizations: No    Attends Banker Meetings: Not on file    Marital Status: Married  Catering manager Violence: Not At Risk (08/11/2023)   Humiliation, Afraid, Rape, and Kick questionnaire    Fear of Current or Ex-Partner: No    Emotionally Abused: No    Physically Abused: No    Sexually Abused: No    Environmental and Social history  Lives in a house with a dry environment, 2 dogs look inside the household, no carpet in the bedroom, plastic in the bed, plastic in the pillow, and no smoking ongoing with inside the household.  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  Objective:   Vitals:  09/15/24 1350  BP: 124/82  Pulse: 70  Resp: 16  SpO2: 98%   Height: 5' 9.5 (176.5 cm) Weight: 218 lb 3.2 oz (99 kg)  Physical Exam Constitutional:      Appearance: He is not diaphoretic.  HENT:     Head: Normocephalic.     Right Ear: Tympanic membrane, ear canal and external ear normal.     Left Ear: Tympanic membrane, ear canal and external ear normal.     Nose: Nose normal. No mucosal edema or rhinorrhea.     Mouth/Throat:     Pharynx: Uvula midline. No oropharyngeal exudate.  Eyes:     Conjunctiva/sclera: Conjunctivae normal.  Neck:     Thyroid: No thyromegaly.     Trachea: Trachea normal. No tracheal tenderness or tracheal deviation.  Cardiovascular:     Rate and Rhythm: Normal rate and regular rhythm.     Heart sounds: Normal heart sounds, S1 normal and S2 normal. No murmur heard. Pulmonary:     Effort: No respiratory distress.     Breath sounds: Normal breath sounds. No stridor. No wheezing or rales.  Lymphadenopathy:     Head:     Right side of head: No tonsillar adenopathy.     Left side of head: No tonsillar adenopathy.     Cervical: No cervical adenopathy.  Skin:    Findings: No erythema or rash.     Nails: There is no clubbing.  Neurological:     Mental Status: He is alert.     Diagnostics:  Allergy skin tests were performed.   Spirometry was performed and demonstrated an FEV1 of *** @ *** % of predicted. FEV1/FVC = ***  The patient had an Asthma Control Test with the following results:  .     Assessment and Plan:    No diagnosis found.  Patient Instructions   1. Return to clinic for skin testing (no antihistamines)  2. Treat and prevent inflammation of airway:   A. Fluticasone - 1 spray each nostril 2 times per day  B. Olopatadine - 1 spray each nostril 2 times per day  3. If needed:   A. Xyzal  - 2 mg - 1 tablet 1-2 times per day  B. Nasal saline multiple times per day  C. Pataday - 1 drop each eye 1 time per day  4. Immunotherapy???  5. Evaluation of immune system defect???  6. Influenza = Tamiflu. Covid = Paxlovid   Camellia DOROTHA Denis, MD Allergy / Immunology [facility] [place] 

## 2024-09-16 ENCOUNTER — Encounter: Payer: Self-pay | Admitting: Allergy and Immunology

## 2024-09-17 ENCOUNTER — Encounter: Payer: Self-pay | Admitting: Physician Assistant

## 2024-09-17 ENCOUNTER — Other Ambulatory Visit: Payer: Self-pay

## 2024-09-17 DIAGNOSIS — E66811 Obesity, class 1: Secondary | ICD-10-CM

## 2024-09-17 MED ORDER — TIRZEPATIDE 10 MG/0.5ML ~~LOC~~ SOAJ: 10.0000 mg | SUBCUTANEOUS | 0 refills | Status: DC

## 2024-09-17 MED ORDER — ZEPBOUND 10 MG/0.5ML ~~LOC~~ SOAJ: 10.0000 mg | SUBCUTANEOUS | 0 refills | Status: DC

## 2024-09-20 ENCOUNTER — Other Ambulatory Visit: Payer: Self-pay

## 2024-09-20 ENCOUNTER — Other Ambulatory Visit

## 2024-09-20 DIAGNOSIS — K760 Fatty (change of) liver, not elsewhere classified: Secondary | ICD-10-CM

## 2024-09-20 DIAGNOSIS — Z1321 Encounter for screening for nutritional disorder: Secondary | ICD-10-CM | POA: Diagnosis not present

## 2024-09-20 DIAGNOSIS — E66811 Obesity, class 1: Secondary | ICD-10-CM | POA: Diagnosis not present

## 2024-09-20 DIAGNOSIS — E7800 Pure hypercholesterolemia, unspecified: Secondary | ICD-10-CM

## 2024-09-21 LAB — COMPREHENSIVE METABOLIC PANEL WITH GFR
ALT: 48 IU/L — ABNORMAL HIGH (ref 0–44)
AST: 30 IU/L (ref 0–40)
Albumin: 4.7 g/dL (ref 4.1–5.1)
Alkaline Phosphatase: 70 IU/L (ref 47–123)
BUN/Creatinine Ratio: 13 (ref 9–20)
BUN: 11 mg/dL (ref 6–24)
Bilirubin Total: 0.8 mg/dL (ref 0.0–1.2)
CO2: 21 mmol/L (ref 20–29)
Calcium: 9.7 mg/dL (ref 8.7–10.2)
Chloride: 104 mmol/L (ref 96–106)
Creatinine, Ser: 0.85 mg/dL (ref 0.76–1.27)
Globulin, Total: 2.4 g/dL (ref 1.5–4.5)
Glucose: 81 mg/dL (ref 70–99)
Potassium: 4 mmol/L (ref 3.5–5.2)
Sodium: 139 mmol/L (ref 134–144)
Total Protein: 7.1 g/dL (ref 6.0–8.5)
eGFR: 111 mL/min/1.73 (ref >59)

## 2024-09-21 LAB — VITAMIN D 25 HYDROXY (VIT D DEFICIENCY, FRACTURES): Vit D, 25-Hydroxy: 33.6 ng/mL (ref 30.0–100.0)

## 2024-09-21 LAB — LIPID PANEL
Chol/HDL Ratio: 3.3 ratio (ref 0.0–5.0)
Cholesterol, Total: 122 mg/dL (ref 100–199)
HDL: 37 mg/dL — ABNORMAL LOW (ref >39)
LDL Chol Calc (NIH): 62 mg/dL (ref 0–99)
Triglycerides: 132 mg/dL (ref 0–149)
VLDL Cholesterol Cal: 23 mg/dL (ref 5–40)

## 2024-09-21 LAB — CBC WITH DIFFERENTIAL/PLATELET
Basophils Absolute: 0.1 x10E3/uL (ref 0.0–0.2)
Basos: 1 %
EOS (ABSOLUTE): 0.1 x10E3/uL (ref 0.0–0.4)
Eos: 2 %
Hematocrit: 45.4 % (ref 37.5–51.0)
Hemoglobin: 15.6 g/dL (ref 13.0–17.7)
Immature Grans (Abs): 0 x10E3/uL (ref 0.0–0.1)
Immature Granulocytes: 0 %
Lymphocytes Absolute: 2.7 x10E3/uL (ref 0.7–3.1)
Lymphs: 46 %
MCH: 32.2 pg (ref 26.6–33.0)
MCHC: 34.4 g/dL (ref 31.5–35.7)
MCV: 94 fL (ref 79–97)
Monocytes Absolute: 0.7 x10E3/uL (ref 0.1–0.9)
Monocytes: 12 %
Neutrophils Absolute: 2.3 x10E3/uL (ref 1.4–7.0)
Neutrophils: 39 %
Platelets: 234 x10E3/uL (ref 150–450)
RBC: 4.85 x10E6/uL (ref 4.14–5.80)
RDW: 12.5 % (ref 11.6–15.4)
WBC: 5.8 x10E3/uL (ref 3.4–10.8)

## 2024-09-21 LAB — HEMOGLOBIN A1C
Est. average glucose Bld gHb Est-mCnc: 97 mg/dL
Hgb A1c MFr Bld: 5 % (ref 4.8–5.6)

## 2024-09-22 ENCOUNTER — Ambulatory Visit: Admitting: Allergy and Immunology

## 2024-09-22 ENCOUNTER — Ambulatory Visit: Admitting: Physician Assistant

## 2024-09-22 VITALS — BP 100/70 | HR 76 | Temp 97.4°F | Resp 16 | Ht 69.5 in | Wt 218.0 lb

## 2024-09-22 DIAGNOSIS — Z23 Encounter for immunization: Secondary | ICD-10-CM | POA: Insufficient documentation

## 2024-09-22 DIAGNOSIS — G4733 Obstructive sleep apnea (adult) (pediatric): Secondary | ICD-10-CM | POA: Insufficient documentation

## 2024-09-22 DIAGNOSIS — J329 Chronic sinusitis, unspecified: Secondary | ICD-10-CM | POA: Diagnosis not present

## 2024-09-22 DIAGNOSIS — E7800 Pure hypercholesterolemia, unspecified: Secondary | ICD-10-CM | POA: Diagnosis not present

## 2024-09-22 DIAGNOSIS — K760 Fatty (change of) liver, not elsewhere classified: Secondary | ICD-10-CM | POA: Diagnosis not present

## 2024-09-22 DIAGNOSIS — E66811 Obesity, class 1: Secondary | ICD-10-CM

## 2024-09-22 NOTE — Assessment & Plan Note (Signed)
  Orders:   Flu vaccine trivalent PF, 6mos and older(Flulaval,Afluria,Fluarix,Fluzone)

## 2024-09-22 NOTE — Progress Notes (Signed)
 Subjective:  Patient ID: Mario Cooper, male    DOB: [birth year]  Age: 43 y.o. MRN: [id number]  Chief Complaint  Patient presents with   Discuss labs results    HPI: Discussed the use of AI scribe software for clinical note transcription with the patient, who gave verbal consent to proceed.  History of Present Illness Mario Cooper is a 43 year old male who presents for follow-up on lab results.  He has experienced improvement in liver function over the past year. He has lost 18 pounds and currently weighs around 214 pounds. Previously, he lost 40 pounds in 30 days using phentermine, which he found too drastic. He is currently using a medication referred to as 'Zip' and monitors his weight for work-related purposes.  He has a history of sleep apnea and notes significant improvement in symptoms since using a CPAP machine. He no longer takes naps and sleeps 5 to 6 hours a night without needing additional rest. His initial sleep study showed 25 events per hour, now reduced to 0.2 events per hour with CPAP use.  He experiences frequent nasal and sinus infections, treated with doxycycline  and a Z-Pak for a double ear infection. He is considering seeing an ENT specialist for further evaluation. A past CT scan showed a dropped sinus, which may contribute to his symptoms.  He is currently taking a statin for cholesterol management and reports no side effects such as muscle pain. He uses olopatadine nasal spray for allergy symptoms. He mentions a family history of heart issues, with his father taking a similar statin medication.  No blood or mucus in stool, pain with defecation, or frequent diarrhea. Reports constipation and ensures adequate fiber intake. No dizziness or lightheadedness.          06/16/2024    8:33 AM 08/11/2023    1:50 PM  Depression screen PHQ 2/9  Decreased Interest 0 0  Down, Depressed, Hopeless 0 0  PHQ - 2 Score 0 0  Altered sleeping 0 0  Tired, decreased energy 2 1  Change  in appetite 1 1  Feeling bad or failure about yourself  0 0  Trouble concentrating 0 0  Moving slowly or fidgety/restless 0 0  Suicidal thoughts 0 0  PHQ-9 Score 3 2  Difficult doing work/chores Not difficult at all         06/16/2024    8:33 AM  Fall Risk   Falls in the past year? 0  Number falls in past yr: 0  Injury with Fall? 0  Risk for fall due to : No Fall Risks  Follow up Falls evaluation completed    Patient Care Team: Milon Cleaves, [place] as PCP - General (Physician Assistant)   Review of Systems  Constitutional:  Negative for chills, fatigue, fever and unexpected weight change.  HENT:  Negative for congestion, ear pain, sinus pain and sore throat.   Respiratory:  Negative for cough and shortness of breath.   Cardiovascular:  Negative for chest pain and palpitations.  Gastrointestinal:  Negative for abdominal pain, blood in stool, constipation, diarrhea, nausea and vomiting.  Endocrine: Negative for polydipsia.  Genitourinary:  Negative for dysuria.  Musculoskeletal:  Negative for back pain.  Skin:  Negative for rash.  Neurological:  Negative for headaches.    Current Outpatient Medications on File Prior to Visit  Medication Sig Dispense Refill   atorvastatin  (LIPITOR) 10 MG tablet Take 1 tablet (10 mg total) by mouth daily. 90 tablet 3   benzonatate (TESSALON)  100 MG capsule Take 200 mg by mouth every 8 (eight) hours as needed.     fluticasone (FLONASE) 50 MCG/ACT nasal spray Use 1 spray in each nostril twice daily 16 g 5   guaiFENesin -codeine  100-10 MG/5ML syrup Take 5 mLs by mouth 3 (three) times daily as needed for cough. 120 mL 0   levocetirizine (XYZAL ) 5 MG tablet TAKE 1 TABLET BY MOUTH EVERY DAY IN THE EVENING 90 tablet 1   Multiple Vitamin (MULTIVITAMIN WITH MINERALS) TABS tablet Take 1 tablet by mouth daily.     Olopatadine HCl 0.6 % SOLN Use 1 spray in each nostril twice daily 30.5 g 5   Omega-3 Fatty Acids (FISH OIL) 500 MG CAPS Take 500 mg by mouth daily.      tirzepatide  (ZEPBOUND ) 10 MG/0.5ML Pen Inject 10 mg into the skin once a week. 6 mL 0   No current facility-administered medications on file prior to visit.   Past Medical History:  Diagnosis Date   Allergy    Encounter for annual physical exam 06/16/2024   Fatty liver    High cholesterol 06/16/2024   History of hiatal hernia    Kidney stones    Osteoarthritis 06/27/2023   Umbilical hernia 11/05/2019   Past Surgical History:  Procedure Laterality Date   DISTAL BICEPS TENDON REPAIR Left 06/01/2020   Procedure: DISTAL BICEPS TENDON REPAIR;  Surgeon: Carolee Lynwood JINNY DOUGLAS, MD;  Location: [facility] OR;  Service: Orthopedics;  Laterality: Left;    HERNIA REPAIR  11/05/2019   umbilical and hiatal    Family History  Problem Relation Age of Onset   Obesity Father    Arthritis Maternal Grandfather    Social History   Socioeconomic History   Marital status: Married    Spouse name: Not on file   Number of children: Not on file   Years of education: Not on file   Highest education level: 12th grade  Occupational History   Not on file  Tobacco Use   Smoking status: Never    Passive exposure: Past (parents smoked while he was growing up)   Smokeless tobacco: Never  Vaping Use   Vaping status: Never Used  Substance and Sexual Activity   Alcohol use: Never   Drug use: Never   Sexual activity: Yes    Birth control/protection: Condom  Other Topics Concern   Not on file  Social History Narrative   Not on file   Social Drivers of Health   Financial Resource Strain: Low Risk  (09/22/2024)   Overall Financial Resource Strain (CARDIA)    Difficulty of Paying Living Expenses: Not hard at all  Food Insecurity: No Food Insecurity (09/22/2024)   Hunger Vital Sign    Worried About Running Out of Food in the Last Year: Never true    Ran Out of Food in the Last Year: Never true  Transportation Needs: No Transportation Needs (09/22/2024)   PRAPARE - Scientist, research (physical sciences) (Medical): No    Lack of Transportation (Non-Medical): No  Physical Activity: Insufficiently Active (09/22/2024)   Exercise Vital Sign    Days of Exercise per Week: 2 days    Minutes of Exercise per Session: 20 min  Stress: No Stress Concern Present (09/22/2024)   [employer] of Occupational Health - Occupational Stress Questionnaire    Feeling of Stress: Not at all  Social Connections: Moderately Isolated (09/22/2024)   Social Connection and Isolation Panel    Frequency of Communication with  Friends and Family: More than three times a week    Frequency of Social Gatherings with Friends and Family: Once a week    Attends Religious Services: Never    Database administrator or Organizations: No    Attends Engineer, structural: Not on file    Marital Status: Married    Objective:  There were no vitals taken for this visit.     09/15/2024    1:50 PM 08/19/2024    1:18 PM 06/16/2024    8:29 AM  BP/Weight  Systolic BP 124 110 108  Diastolic BP 82 70 70  Wt. (Lbs) 218.2 223.2 233.4  BMI 31.76 kg/m2 31.13 kg/m2 32.55 kg/m2    Physical Exam Vitals reviewed.  Constitutional:      Appearance: Normal appearance.  Cardiovascular:     Rate and Rhythm: Normal rate and regular rhythm.     Heart sounds: Normal heart sounds.  Pulmonary:     Effort: Pulmonary effort is normal.     Breath sounds: Normal breath sounds.  Abdominal:     General: Bowel sounds are normal.     Palpations: Abdomen is soft.     Tenderness: There is no abdominal tenderness.  Neurological:     Mental Status: He is alert and oriented to person, place, and time.  Psychiatric:        Mood and Affect: Mood normal.        Behavior: Behavior normal.         Lab Results  Component Value Date   WBC 5.8 09/20/2024   HGB 15.6 09/20/2024   HCT 45.4 09/20/2024   PLT 234 09/20/2024   GLUCOSE 81 09/20/2024   CHOL 122 09/20/2024   TRIG 132 09/20/2024   HDL 37 (L) 09/20/2024    LDLCALC 62 09/20/2024   ALT 48 (H) 09/20/2024   AST 30 09/20/2024   NA 139 09/20/2024   K 4.0 09/20/2024   CL 104 09/20/2024   CREATININE 0.85 09/20/2024   BUN 11 09/20/2024   CO2 21 09/20/2024   TSH 1.320 08/18/2023   HGBA1C 5.0 09/20/2024    Results for orders placed or performed in visit on 09/20/24  VITAMIN D 25 Hydroxy (Vit-D Deficiency, Fractures)   Collection Time: 09/20/24  3:07 PM  Result Value Ref Range   Vit D, 25-Hydroxy 33.6 30.0 - 100.0 ng/mL  Lipid panel   Collection Time: 09/20/24  3:07 PM  Result Value Ref Range   Cholesterol, Total 122 100 - 199 mg/dL   Triglycerides 867 0 - 149 mg/dL   HDL 37 (L) >60 mg/dL   VLDL Cholesterol Cal 23 5 - 40 mg/dL   LDL Chol Calc (NIH) 62 0 - 99 mg/dL   Chol/HDL Ratio 3.3 0.0 - 5.0 ratio  Hemoglobin A1c   Collection Time: 09/20/24  3:07 PM  Result Value Ref Range   Hgb A1c MFr Bld 5.0 4.8 - 5.6 %   Est. average glucose Bld gHb Est-mCnc 97 mg/dL  Comprehensive metabolic panel with GFR   Collection Time: 09/20/24  3:07 PM  Result Value Ref Range   Glucose 81 70 - 99 mg/dL   BUN 11 6 - 24 mg/dL   Creatinine, Ser 9.14 0.76 - 1.27 mg/dL   eGFR 888 >40 fO/fpw/8.26   BUN/Creatinine Ratio 13 9 - 20   Sodium 139 134 - 144 mmol/L   Potassium 4.0 3.5 - 5.2 mmol/L   Chloride 104 96 - 106 mmol/L   CO2 21  20 - 29 mmol/L   Calcium  9.7 8.7 - 10.2 mg/dL   Total Protein 7.1 6.0 - 8.5 g/dL   Albumin 4.7 4.1 - 5.1 g/dL   Globulin, Total 2.4 1.5 - 4.5 g/dL   Bilirubin Total 0.8 0.0 - 1.2 mg/dL   Alkaline Phosphatase 70 47 - 123 IU/L   AST 30 0 - 40 IU/L   ALT 48 (H) 0 - 44 IU/L  CBC with Differential/Platelet   Collection Time: 09/20/24  3:07 PM  Result Value Ref Range   WBC 5.8 3.4 - 10.8 x10E3/uL   RBC 4.85 4.14 - 5.80 x10E6/uL   Hemoglobin 15.6 13.0 - 17.7 g/dL   Hematocrit 54.5 62.4 - 51.0 %   MCV 94 79 - 97 fL   MCH 32.2 26.6 - 33.0 pg   MCHC 34.4 31.5 - 35.7 g/dL   RDW 87.4 88.3 - 84.5 %   Platelets 234 150 - 450  x10E3/uL   Neutrophils 39 Not Estab. %   Lymphs 46 Not Estab. %   Monocytes 12 Not Estab. %   Eos 2 Not Estab. %   Basos 1 Not Estab. %   Neutrophils Absolute 2.3 1.4 - 7.0 x10E3/uL   Lymphocytes Absolute 2.7 0.7 - 3.1 x10E3/uL   Monocytes Absolute 0.7 0.1 - 0.9 x10E3/uL   EOS (ABSOLUTE) 0.1 0.0 - 0.4 x10E3/uL   Basophils Absolute 0.1 0.0 - 0.2 x10E3/uL   Immature Granulocytes 0 Not Estab. %   Immature Grans (Abs) 0.0 0.0 - 0.1 x10E3/uL  .  Assessment & Plan:   Assessment & Plan Recurrent sinus infections Chronic/recurrent sinusitis Frequent sinus infections. Prefers ENT referral over allergist. Previous scans noted dropped sinus. - Refer to ENT, Dr. Honor, for evaluation and potential balloon sinuplasty. - Consider face CT scan to assess sinus anatomy and potential issues. Orders:   Ambulatory referral to ENT  Encounter for immunization  Orders:   Flu vaccine trivalent PF, 6mos and older(Flulaval,Afluria,Fluarix,Fluzone)  Elevated LDL cholesterol level Hyperlipidemia Cholesterol levels improved with weight loss and statin therapy. No adverse effects reported. Family history of heart disease noted. Cardiologists recommend cholesterol below 50. - Continue statin therapy. - Consider calcium  scoring test at age 18 or earlier if desired.    Fatty liver Fatty liver disease Liver function tests improved, likely due to weight loss and better cholesterol levels. Tests still slightly elevated but decreasing. - Repeat liver function tests in six months.    Obesity (BMI 30.0-34.9) Obesity 18-pound weight loss achieved with lifestyle changes and medication. Current weight 218 pounds. Satisfied with gradual weight loss. Start 10 mg dose of Zepbound  next Sunday. - Continue current weight loss regimen. - Start 10 mg dose of Zepbound  next Sunday. - Consider using [facility] for medication refills to save costs.    OSA (obstructive sleep apnea) Obstructive sleep apnea Improved  sleep quality and energy with CPAP. Events per hour reduced significantly. Potential reduced CPAP need with continued weight loss. - Continue CPAP therapy. - Monitor sleep quality and report any changes.    General Health Maintenance Engaged in lifestyle modifications with increased fiber and green vegetables. Considering future cardiac screenings due to family history of heart disease. - Encourage continued healthy eating habits. - Discuss potential cardiac screenings in the future.    No orders of the defined types were placed in this encounter.   No orders of the defined types were placed in this encounter.      Follow-up: No follow-ups on file.  An After  Visit Summary was printed and given to the patient.  Nola Angles, [place] [facility] [phone]

## 2024-09-22 NOTE — Assessment & Plan Note (Signed)
 Hyperlipidemia Cholesterol levels improved with weight loss and statin therapy. No adverse effects reported. Family history of heart disease noted. Cardiologists recommend cholesterol below 50. - Continue statin therapy. - Consider calcium  scoring test at age 43 or earlier if desired.

## 2024-09-22 NOTE — Assessment & Plan Note (Signed)
 Fatty liver disease Liver function tests improved, likely due to weight loss and better cholesterol levels. Tests still slightly elevated but decreasing. - Repeat liver function tests in six months.

## 2024-09-22 NOTE — Assessment & Plan Note (Signed)
 Obesity 18-pound weight loss achieved with lifestyle changes and medication. Current weight 218 pounds. Satisfied with gradual weight loss. Start 10 mg dose of Zepbound  next Sunday. - Continue current weight loss regimen. - Start 10 mg dose of Zepbound  next Sunday. - Consider using [facility] for medication refills to save costs.

## 2024-09-22 NOTE — Assessment & Plan Note (Addendum)
 Chronic/recurrent sinusitis Frequent sinus infections. Prefers ENT referral over allergist. Previous scans noted dropped sinus. - Refer to ENT, Dr. Honor, for evaluation and potential balloon sinuplasty. - Consider face CT scan to assess sinus anatomy and potential issues. Orders:   Ambulatory referral to ENT

## 2024-09-22 NOTE — Assessment & Plan Note (Signed)
 Obstructive sleep apnea Improved sleep quality and energy with CPAP. Events per hour reduced significantly. Potential reduced CPAP need with continued weight loss. - Continue CPAP therapy. - Monitor sleep quality and report any changes.

## 2024-09-23 ENCOUNTER — Ambulatory Visit: Payer: Self-pay | Admitting: Physician Assistant

## 2024-10-12 MED ORDER — TIRZEPATIDE-WEIGHT MANAGEMENT 12.5 MG/0.5ML ~~LOC~~ SOAJ: 12.5000 mg | Freq: Once | SUBCUTANEOUS | Status: DC

## 2024-10-12 NOTE — Addendum Note (Signed)
 Addended by: TWILLA DODRILL C on: 10/12/2024 11:28 AM   Modules accepted: Orders

## 2024-10-13 ENCOUNTER — Other Ambulatory Visit: Payer: Self-pay

## 2024-10-13 ENCOUNTER — Telehealth: Payer: Self-pay | Admitting: Physician Assistant

## 2024-10-13 ENCOUNTER — Other Ambulatory Visit: Payer: Self-pay | Admitting: Family Medicine

## 2024-10-13 DIAGNOSIS — R0981 Nasal congestion: Secondary | ICD-10-CM | POA: Diagnosis not present

## 2024-10-13 DIAGNOSIS — J329 Chronic sinusitis, unspecified: Secondary | ICD-10-CM | POA: Diagnosis not present

## 2024-10-13 MED ORDER — TIRZEPATIDE-WEIGHT MANAGEMENT 12.5 MG/0.5ML ~~LOC~~ SOAJ: 12.5000 mg | SUBCUTANEOUS | 0 refills | Status: DC

## 2024-10-13 MED ORDER — TIRZEPATIDE-WEIGHT MANAGEMENT 12.5 MG/0.5ML ~~LOC~~ SOAJ: 12.5000 mg | SUBCUTANEOUS | Status: DC

## 2024-10-13 MED ORDER — TIRZEPATIDE 12.5 MG/0.5ML ~~LOC~~ SOAJ: 12.5000 mg | SUBCUTANEOUS | 0 refills | Status: DC

## 2024-10-13 NOTE — Telephone Encounter (Signed)
 [facility]ounjaro  Pen Injector Forms

## 2024-10-13 NOTE — Telephone Encounter (Signed)
 Rx sent for zepbound  12.5mg  to mail order.  Med was accidentally ordered for in house/clinic vs being sent as a rx.

## 2024-10-13 NOTE — Telephone Encounter (Signed)
 Copied from CRM [id number]. Topic: Clinical - Prescription Issue >> Oct 13, 2024 12:36 PM Mario Cooper wrote: Reason for CRM: Says he will call back and speak to the clinic bc they keep messing up his medicine. >> Oct 13, 2024  1:21 PM Mario Cooper wrote: Patient stated his rx is still showing tirzepatide  (MOUNJARO ) 12.5 MG/0.5ML Pen [[id number]] was informed the medication for zepbound  was sent out today for mail order, patient wanted to be sure this is correct. Please advise [phone]

## 2024-10-13 NOTE — Telephone Encounter (Signed)
 Medication has been sent.

## 2024-12-12 ENCOUNTER — Telehealth: Admitting: Family

## 2024-12-12 DIAGNOSIS — B9689 Other specified bacterial agents as the cause of diseases classified elsewhere: Secondary | ICD-10-CM

## 2024-12-12 DIAGNOSIS — J019 Acute sinusitis, unspecified: Secondary | ICD-10-CM | POA: Diagnosis not present

## 2024-12-12 MED ORDER — LEVOFLOXACIN 500 MG PO TABS: 500.0000 mg | ORAL_TABLET | Freq: Every day | ORAL | 0 refills | Status: AC

## 2024-12-12 NOTE — Progress Notes (Signed)
 " Virtual Visit Consent   Mario Cooper, you are scheduled for a virtual visit with a [facility] provider today. Just as with appointments in the office, your consent must be obtained to participate. Your consent will be active for this visit and any virtual visit you may have with one of our providers in the next 365 days. If you have a MyChart account, a copy of this consent can be sent to you electronically.  As this is a virtual visit, video technology does not allow for your provider to perform a traditional examination. This may limit your provider's ability to fully assess your condition. If your provider identifies any concerns that need to be evaluated in person or the need to arrange testing (such as labs, EKG, etc.), we will make arrangements to do so. Although advances in technology are sophisticated, we cannot ensure that it will always work on either your end or our end. If the connection with a video visit is poor, the visit may have to be switched to a telephone visit. With either a video or telephone visit, we are not always able to ensure that we have a secure connection.  By engaging in this virtual visit, you consent to the provision of healthcare and authorize for your insurance to be billed (if applicable) for the services provided during this visit. Depending on your insurance coverage, you may receive a charge related to this service.  I need to obtain your verbal consent now. Are you willing to proceed with your visit today? Mario Cooper has provided verbal consent on 12/12/2024 for a virtual visit (video or telephone). Bari Learn, FNP  Date: 12/12/2024 12:25 PM   Virtual Visit via Video Note   I, Bari Learn, connected with  Mario Cooper  ([id number], 1981-02-25) on 12/12/2024 at 12:15 PM EST by a video-enabled telemedicine application and verified that I am speaking with the correct person using two identifiers.  Location: Patient: Virtual Visit Location Patient: Home Provider:  Virtual Visit Location Provider: Home Office   I discussed the limitations of evaluation and management by telemedicine and the availability of in person appointments. The patient expressed understanding and agreed to proceed.    History of Present Illness: Mario Cooper is a 44 y.o. who identifies as a male who was assigned male at birth, and is being seen today for sinus congestion and pressure that started a week ago.  HPI: Sinus Problem This is a new problem. The current episode started 1 to 4 weeks ago. The problem has been gradually worsening since onset. His pain is at a severity of 3/10. The pain is mild. Associated symptoms include congestion, coughing, ear pain (left), headaches, a hoarse voice, sinus pressure, sneezing and a sore throat. Pertinent negatives include no shortness of breath. Past treatments include oral decongestants and acetaminophen . The treatment provided mild relief.    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   Atherosclerosis of aorta 08/21/2023   Fatty liver    History of hiatal hernia    Kidney stones    Arthralgia of right  ankle 06/27/2023   Osteoarthritis 06/27/2023   Arthralgia of left elbow 05/23/2020   Rupture of distal biceps tendon 05/23/2020   Elevated LDL cholesterol level 03/22/2020   Elevated LFTs 03/22/2020   Postoperative examination 11/17/2019   Ventral hernia without obstruction or gangrene 10/06/2019   Family history of cancer 04/10/2018   Obesity (BMI 30.0-34.9) 04/10/2018   Seasonal allergies 04/10/2018     Allergies: Allergies[1] Medications: Current Medications[2]  Observations/Objective: Patient is well-developed, well-nourished in no acute distress.  Resting comfortably  at home.  Head is normocephalic, atraumatic.  No labored breathing.  Speech is clear and coherent with logical content.  Patient is alert and oriented at baseline.  Nasal congestion  Assessment and Plan: 1. Acute bacterial sinusitis (Primary) - levofloxacin  (LEVAQUIN ) 500 MG tablet; Take 1 tablet (500 mg total) by mouth daily for 7 days.  Dispense: 7 tablet; Refill: 0  - Take meds as prescribed - Use a cool mist humidifier  -Use saline nose sprays frequently -Force fluids -For any cough or congestion  Use plain Mucinex - regular strength or max strength is fine -For fever or aces or pains- take tylenol  or ibuprofen. -Throat lozenges if help -Follow up if symptoms worsen or do not improve   Follow Up Instructions: I discussed the assessment and treatment plan with the patient. The patient was provided an opportunity to ask questions and all were answered. The patient agreed with the plan and demonstrated an understanding of the instructions.  A copy of instructions were sent to the patient via MyChart unless otherwise noted below.     The patient was advised to call back or seek an in-person evaluation if the symptoms worsen or if the condition fails to improve as anticipated.    Bari Learn, FNP     [1]  Allergies Allergen Reactions   Penicillins Other (See Comments)    Childhood reaction  [2]  Current Outpatient Medications:    levofloxacin  (LEVAQUIN ) 500 MG tablet, Take 1 tablet (500 mg total) by mouth daily for 7 days., Disp: 7 tablet, Rfl: 0   atorvastatin  (LIPITOR) 10 MG tablet, Take 1 tablet (10 mg total) by mouth daily., Disp: 90 tablet, Rfl: 3   fluticasone  (FLONASE ) 50 MCG/ACT nasal spray, Use 1 spray in each nostril twice daily, Disp: 16 g, Rfl: 5   levocetirizine (XYZAL ) 5 MG tablet,  TAKE 1 TABLET BY MOUTH EVERY DAY IN THE EVENING, Disp: 90 tablet, Rfl: 1   Multiple Vitamin (MULTIVITAMIN WITH MINERALS) TABS tablet, Take 1 tablet by mouth daily., Disp: , Rfl:    Olopatadine  HCl 0.6 % SOLN, Place 1 spray into the nose daily., Disp: , Rfl:    Omega-3 Fatty Acids (FISH OIL) 500 MG CAPS, Take 500 mg by mouth daily., Disp: , Rfl:    tirzepatide  (ZEPBOUND ) 12.5 MG/0.5ML Pen, Inject 12.5 mg into the skin once a week., Disp: 6 mL, Rfl: 0  "

## 2024-12-12 NOTE — Patient Instructions (Signed)

## 2024-12-21 ENCOUNTER — Encounter: Payer: Self-pay | Admitting: Physician Assistant

## 2024-12-21 ENCOUNTER — Other Ambulatory Visit: Payer: Self-pay

## 2024-12-21 DIAGNOSIS — E66811 Obesity, class 1: Secondary | ICD-10-CM

## 2024-12-21 DIAGNOSIS — G4733 Obstructive sleep apnea (adult) (pediatric): Secondary | ICD-10-CM

## 2024-12-21 MED ORDER — TIRZEPATIDE-WEIGHT MANAGEMENT 15 MG/0.5ML ~~LOC~~ SOAJ: 15.0000 mg | SUBCUTANEOUS | 2 refills | Status: AC

## 2024-12-22 ENCOUNTER — Other Ambulatory Visit (HOSPITAL_COMMUNITY): Payer: Self-pay

## 2024-12-28 ENCOUNTER — Other Ambulatory Visit (HOSPITAL_COMMUNITY): Payer: Self-pay

## 2025-01-04 ENCOUNTER — Other Ambulatory Visit (HOSPITAL_COMMUNITY): Payer: Self-pay

## 2025-01-04 ENCOUNTER — Telehealth: Payer: Self-pay | Admitting: Pharmacy Technician

## 2025-01-04 NOTE — Telephone Encounter (Signed)
 Pharmacy Patient Advocate Encounter   Received notification from [employer] that prior authorization for Zepbound  15MG /0.5ML pen-injectors is required/requested.   Insurance verification completed.   The patient is insured through [facility].   Per test claim: PA required; PA submitted to above mentioned insurance via Latent Key/confirmation #/EOC [id number] Status is pending

## 2025-01-04 NOTE — Telephone Encounter (Signed)
 Pharmacy Patient Advocate Encounter  Received notification from [employer] that Prior Authorization for Zepbound  15MG /0.5ML pen-injectors  has been APPROVED from 01/04/2025 to 01/04/2026. Ran test claim, Copay is $40.00. This test claim was processed through [facility]- copay amounts may vary at other pharmacies due to pharmacy/plan contracts, or as the patient moves through the different stages of their insurance plan.   PA #/Case ID/Reference #: [id number]
